# Patient Record
Sex: Female | Born: 1995
Health system: Southern US, Community
[De-identification: ages and names within clinical notes are randomized; demographics above are authoritative.]

## PROBLEM LIST (undated history)

## (undated) DIAGNOSIS — G43909 Migraine, unspecified, not intractable, without status migrainosus: Secondary | ICD-10-CM

## (undated) DIAGNOSIS — R51 Headache: Secondary | ICD-10-CM

## (undated) DIAGNOSIS — B019 Varicella without complication: Secondary | ICD-10-CM

## (undated) DIAGNOSIS — D649 Anemia, unspecified: Secondary | ICD-10-CM

## (undated) DIAGNOSIS — R519 Headache, unspecified: Secondary | ICD-10-CM

## (undated) HISTORY — DX: Varicella without complication: B01.9

## (undated) HISTORY — DX: Headache: R51

## (undated) HISTORY — DX: Migraine, unspecified, not intractable, without status migrainosus: G43.909

## (undated) HISTORY — DX: Headache, unspecified: R51.9

## (undated) HISTORY — DX: Anemia, unspecified: D64.9

---
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
## 2016-03-12 LAB — HM PAP SMEAR

## 2016-11-19 DIAGNOSIS — J029 Acute pharyngitis, unspecified: Secondary | ICD-10-CM | POA: Diagnosis not present

## 2016-11-19 DIAGNOSIS — J0300 Acute streptococcal tonsillitis, unspecified: Secondary | ICD-10-CM | POA: Diagnosis not present

## 2017-03-05 ENCOUNTER — Encounter: Payer: Self-pay | Admitting: Family Medicine

## 2017-03-05 ENCOUNTER — Ambulatory Visit (INDEPENDENT_AMBULATORY_CARE_PROVIDER_SITE_OTHER): Payer: 59 | Admitting: Family Medicine

## 2017-03-05 ENCOUNTER — Ambulatory Visit: Payer: Self-pay | Admitting: Family Medicine

## 2017-03-05 VITALS — BP 80/60 | HR 80 | Temp 98.3°F | Ht 67.0 in | Wt 161.9 lb

## 2017-03-05 DIAGNOSIS — Z7689 Persons encountering health services in other specified circumstances: Secondary | ICD-10-CM | POA: Diagnosis not present

## 2017-03-05 DIAGNOSIS — K30 Functional dyspepsia: Secondary | ICD-10-CM | POA: Diagnosis not present

## 2017-03-05 DIAGNOSIS — R51 Headache: Secondary | ICD-10-CM | POA: Diagnosis not present

## 2017-03-05 DIAGNOSIS — R110 Nausea: Secondary | ICD-10-CM

## 2017-03-05 DIAGNOSIS — R519 Headache, unspecified: Secondary | ICD-10-CM

## 2017-03-05 LAB — COMPREHENSIVE METABOLIC PANEL
ALBUMIN: 4.1 g/dL (ref 3.5–5.2)
ALK PHOS: 60 U/L (ref 39–117)
ALT: 10 U/L (ref 0–35)
AST: 17 U/L (ref 0–37)
BILIRUBIN TOTAL: 0.2 mg/dL (ref 0.2–1.2)
BUN: 8 mg/dL (ref 6–23)
CO2: 28 mEq/L (ref 19–32)
Calcium: 8.3 mg/dL — ABNORMAL LOW (ref 8.4–10.5)
Chloride: 103 mEq/L (ref 96–112)
Creatinine, Ser: 0.61 mg/dL (ref 0.40–1.20)
GFR: 128.43 mL/min (ref >60.00)
Glucose, Bld: 84 mg/dL (ref 70–99)
POTASSIUM: 4 meq/L (ref 3.5–5.1)
SODIUM: 138 meq/L (ref 135–145)
TOTAL PROTEIN: 7.1 g/dL (ref 6.0–8.3)

## 2017-03-05 LAB — CBC
HEMATOCRIT: 35.6 % — AB (ref 36.0–46.0)
HEMOGLOBIN: 10.8 g/dL — AB (ref 12.0–15.0)
MCHC: 30.3 g/dL (ref 30.0–36.0)
MCV: 70.2 fl — ABNORMAL LOW (ref 78.0–100.0)
Platelets: 319 10*3/uL (ref 150.0–400.0)
RBC: 5.07 Mil/uL (ref 3.87–5.11)
RDW: 16.1 % — ABNORMAL HIGH (ref 11.5–15.5)
WBC: 5.7 10*3/uL (ref 4.0–10.5)

## 2017-03-05 NOTE — Progress Notes (Signed)
HPI:  April Gibbs is here to establish care. She sees a gynecologist, Dr. Alesia Richards, for her birth control in GYN physicals.   Has the following chronic problems that require follow up and concerns today:  History of migraines and frequent headaches: -She had this all through high school and college, headaches are actually improved now -Rarely has migraines anymore -She does have several headaches per week that are minor, she uses essential oils for this -She thinks it may be related to looking at her computer screen all day -These are unchanged and have no alarm features -does not use over-the-counter analgesics more than twice weekly  Indigestion/chronic constipation: -For many years -Takes a fiber supplement every morning in her coffee -Gets bloating, epigastric discomfort and nausea with food - this has been worse for about the last year -Denies diarrhea, vomiting, melena, hematochezia, mucus in the stools, unexplained weight loss, fever or malaise -Periods are monthly and regular, reports her first day of her last menstrual period was now  ROS negative for unless reported above: fevers, unintentional weight loss, hearing or vision loss, chest pain, palpitations, struggling to breath, hemoptysis, melena, hematochezia, hematuria, falls, loc, si, thoughts of self harm  Past Medical History:  Diagnosis Date  . Chicken pox   . Frequent headaches   . Migraines     Past Surgical History:  Procedure Laterality Date  . WISDOM TOOTH EXTRACTION  2013    Family History  Problem Relation Age of Onset  . Miscarriages / [place] Mother   . Alcohol abuse Father   . Depression Father   . Diabetes Father   . Heart disease Father   . Hypertension Father   . Learning disabilities Brother   . Birth defects Maternal Grandmother   . Asthma Maternal Grandmother   . Diabetes Maternal Grandfather   . Hyperlipidemia Maternal Grandfather   . Hypertension Maternal Grandfather   .  Birth defects Maternal Grandfather   . Arthritis Paternal Grandmother   . Asthma Paternal Grandmother   . Diabetes Paternal Grandmother   . Hyperlipidemia Paternal Grandmother   . Hypertension Paternal Grandmother   . Miscarriages / Stillbirths Paternal Grandmother   . Cancer Paternal Grandfather   . Early death Paternal Grandfather   . Hypertension Paternal Grandfather     Social History   Social History  . Marital status: Single    Spouse name: N/A  . Number of children: N/A  . Years of education: N/A   Social History Main Topics  . Smoking status: Never Smoker  . Smokeless tobacco: Never Used  . Alcohol use None  . Drug use: Unknown  . Sexual activity: Not Asked   Other Topics Concern  . None   Social History Narrative   Work or Water engineer, [employer]      Home Situation:this with her fianc      Spiritual Beliefs:      Lifestyle:        Current Outpatient Prescriptions:  .  Norgestimate-Ethinyl Estradiol Triphasic (TRI-PREVIFEM) 0.18/0.215/0.25 MG-35 MCG tablet, Take by mouth., Disp: , Rfl:   EXAM:  Vitals:   03/05/17 1055  BP: (!) 80/60  Pulse: 80  Temp: 98.3 F (36.8 C)    Body mass index is 25.36 kg/m.  GENERAL: vitals reviewed and listed above, alert, oriented, appears well hydrated and in no acute distress  HEENT: atraumatic, conjunttiva clear, no obvious abnormalities on inspection of external nose and ears  NECK: no obvious masses on inspection  LUNGS: clear  to auscultation bilaterally, no wheezes, rales or rhonchi, good air movement  CV: HRRR, no peripheral edema  MS: moves all extremities without noticeable abnormality  PSYCH: pleasant and cooperative, no obvious depression or anxiety  ASSESSMENT AND PLAN:  Discussed the following assessment and plan:  Encounter to establish care  Frequent headaches  Indigestion - Plan: CBC, Celiac Disease Comprehensive Panel with Reflexes, Comprehensive metabolic panel,  Helicobacter pylori special antigen  Nausea -We reviewed the PMH, PSH, FH, SH, Meds and Allergies. -see patient instructions and orders for plan  -Patient advised to return or notify a doctor immediately if symptoms worsen or persist or new concerns arise.  Patient Instructions  BEFORE YOU LEAVE: -follow up: 1 month -labs  We have ordered labs or studies at this visit. It can take up to 1-2 weeks for results and processing. IF results require follow up or explanation, we will call you with instructions. Clinically stable results will be released to your [facility]. If you have not heard from [place] or cannot find your results in [facility] in 2 weeks please contact our office at [phone].  If you are not yet signed up for [facility], please consider signing up.  Nexium over the counter once daily for 2 weeks  Tiger balm (menthol) can help for the mild headaches  Ensure good posture at computer  Try a trial off dairy if the above measures do not help your stomach issues   Food Choices for Gastroesophageal Reflux Disease, Adult When you have gastroesophageal reflux disease (GERD), the foods you eat and your eating habits are very important. Choosing the right foods can help ease your discomfort. What guidelines do I need to follow?  Choose fruits, vegetables, whole grains, and low-fat dairy products.  Choose low-fat meat, fish, and poultry.  Limit fats such as oils, salad dressings, butter, nuts, and avocado.  Keep a food diary. This helps you identify foods that cause symptoms.  Avoid foods that cause symptoms. These may be different for everyone.  Eat small meals often instead of 3 large meals a day.  Eat your meals slowly, in a place where you are relaxed.  Limit fried foods.  Cook foods using methods other than frying.  Avoid drinking alcohol.  Avoid drinking large amounts of liquids with your meals.  Avoid bending over or lying down until 2-3 hours after eating. What  foods are not recommended? These are some foods and drinks that may make your symptoms worse: Vegetables Tomatoes. Tomato juice. Tomato and spaghetti sauce. Chili peppers. Onion and garlic. Horseradish. Fruits Oranges, grapefruit, and lemon (fruit and juice). Meats High-fat meats, fish, and poultry. This includes hot dogs, ribs, ham, sausage, salami, and bacon. Dairy Whole milk and chocolate milk. Sour cream. Cream. Butter. Ice cream. Cream cheese. Drinks Coffee and tea. Bubbly (carbonated) drinks or energy drinks. Condiments Hot sauce. Barbecue sauce. Sweets/Desserts Chocolate and cocoa. Donuts. Peppermint and spearmint. Fats and Oils High-fat foods. This includes [place] fries and potato chips. Other Vinegar. Strong spices. This includes black pepper, white pepper, red pepper, cayenne, curry powder, cloves, ginger, and chili powder. The items listed above may not be a complete list of foods and drinks to avoid. Contact your dietitian for more information. This information is not intended to replace advice given to you by your health care provider. Make sure you discuss any questions you have with your health care provider. Document Released: 11/10/2011 Document Revised: 10/17/2015 Document Reviewed: 03/15/2013 Elsevier Interactive Patient Education  2017 [employer].  Colin Benton R.

## 2017-03-05 NOTE — Patient Instructions (Signed)
BEFORE YOU LEAVE: -follow up: 1 month -labs  We have ordered labs or studies at this visit. It can take up to 1-2 weeks for results and processing. IF results require follow up or explanation, we will call you with instructions. Clinically stable results will be released to your [facility]. If you have not heard from [place] or cannot find your results in [facility] in 2 weeks please contact our office at [phone].  If you are not yet signed up for [facility], please consider signing up.  Nexium over the counter once daily for 2 weeks  Tiger balm (menthol) can help for the mild headaches  Ensure good posture at computer  Try a trial off dairy if the above measures do not help your stomach issues   Food Choices for Gastroesophageal Reflux Disease, Adult When you have gastroesophageal reflux disease (GERD), the foods you eat and your eating habits are very important. Choosing the right foods can help ease your discomfort. What guidelines do I need to follow?  Choose fruits, vegetables, whole grains, and low-fat dairy products.  Choose low-fat meat, fish, and poultry.  Limit fats such as oils, salad dressings, butter, nuts, and avocado.  Keep a food diary. This helps you identify foods that cause symptoms.  Avoid foods that cause symptoms. These may be different for everyone.  Eat small meals often instead of 3 large meals a day.  Eat your meals slowly, in a place where you are relaxed.  Limit fried foods.  Cook foods using methods other than frying.  Avoid drinking alcohol.  Avoid drinking large amounts of liquids with your meals.  Avoid bending over or lying down until 2-3 hours after eating. What foods are not recommended? These are some foods and drinks that may make your symptoms worse: Vegetables Tomatoes. Tomato juice. Tomato and spaghetti sauce. Chili peppers. Onion and garlic. Horseradish. Fruits Oranges, grapefruit, and lemon (fruit and juice). Meats High-fat meats,  fish, and poultry. This includes hot dogs, ribs, ham, sausage, salami, and bacon. Dairy Whole milk and chocolate milk. Sour cream. Cream. Butter. Ice cream. Cream cheese. Drinks Coffee and tea. Bubbly (carbonated) drinks or energy drinks. Condiments Hot sauce. Barbecue sauce. Sweets/Desserts Chocolate and cocoa. Donuts. Peppermint and spearmint. Fats and Oils High-fat foods. This includes [place] fries and potato chips. Other Vinegar. Strong spices. This includes black pepper, white pepper, red pepper, cayenne, curry powder, cloves, ginger, and chili powder. The items listed above may not be a complete list of foods and drinks to avoid. Contact your dietitian for more information. This information is not intended to replace advice given to you by your health care provider. Make sure you discuss any questions you have with your health care provider. Document Released: 11/10/2011 Document Revised: 10/17/2015 Document Reviewed: 03/15/2013 Elsevier Interactive Patient Education  2017 [employer].

## 2017-03-08 ENCOUNTER — Encounter: Payer: Self-pay | Admitting: Internal Medicine

## 2017-03-08 LAB — CELIAC DISEASE COMPREHENSIVE PANEL WITH REFLEXES
(tTG) Ab, IgA: 1 U/mL
Immunoglobulin A: 172 mg/dL (ref 81–463)

## 2017-03-08 NOTE — Addendum Note (Signed)
Addended by: Agnes Lawrence on: 03/08/2017 12:03 PM   Modules accepted: Orders

## 2017-03-11 ENCOUNTER — Telehealth: Payer: Self-pay | Admitting: *Deleted

## 2017-03-11 LAB — HELICOBACTER PYLORI  SPECIAL ANTIGEN
MICRO NUMBER:: 81159456
SPECIMEN QUALITY: ADEQUATE

## 2017-03-11 NOTE — Telephone Encounter (Signed)
Copied from [place] #[id number]. Topic: Inquiry >> Mar 10, 2017  4:28 PM Suggs, Tammy E wrote: Reason for CRM: Patient is requesting a call back.  She states she received a call about lab results but it wasn't the best time so she can't remember what all was said. She wants to discuss these again.    03/11/2017-5:39pm I left a detailed message with the results at the pts cell number and asked that she call back if she has any questions.

## 2017-03-16 ENCOUNTER — Ambulatory Visit (INDEPENDENT_AMBULATORY_CARE_PROVIDER_SITE_OTHER): Payer: 59 | Admitting: Internal Medicine

## 2017-03-16 ENCOUNTER — Encounter: Payer: Self-pay | Admitting: Internal Medicine

## 2017-03-16 VITALS — BP 88/60 | HR 76 | Ht 66.5 in | Wt 162.4 lb

## 2017-03-16 DIAGNOSIS — K219 Gastro-esophageal reflux disease without esophagitis: Secondary | ICD-10-CM

## 2017-03-16 DIAGNOSIS — R1013 Epigastric pain: Secondary | ICD-10-CM

## 2017-03-16 DIAGNOSIS — D509 Iron deficiency anemia, unspecified: Secondary | ICD-10-CM

## 2017-03-16 DIAGNOSIS — R110 Nausea: Secondary | ICD-10-CM | POA: Diagnosis not present

## 2017-03-16 MED ORDER — FERROUS SULFATE 325 (65 FE) MG PO TABS: 325.0000 mg | ORAL_TABLET | Freq: Two times a day (BID) | ORAL | 6 refills | Status: DC

## 2017-03-16 NOTE — Progress Notes (Signed)
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 Maudie Mercury with chief complaints of abdominal pain, nausea, and irregular bowel habits. Patient denies prior history of GI evaluations. She reports that her symptoms began approximately 1 year ago. She describes epigastric discomfort which is often occurring after eating. This typically last 3-4 hours. Often helped by belching when she consumes carbonated products. Also intermittent problems with nausea but no vomiting. Recently placed on over-the-counter PPI approximately 1 week ago. This did help classic reflux symptoms but not other symptoms. Her weight is stable. She has noticed dark stools but has been consuming Pepto-Bismol. She tells me that her bowel habits are unchanged over the years. Typically has a bowel movement every 3-5 days. No improvement in any abdominal complaints post defecation. Some bloating. Recent blood work revealed negative Helicobacter pylori stool antigen. Normal comprehensive metabolic panel including liver tests. However she was anemic with hemoglobin 10.8 and low MCV at 70.2. This is new. Patient has regular menstrual periods. She also is a regular blood donor multiple times per year over the years. She does not take an iron supplement. She rarely uses NSAIDs.  REVIEW OF SYSTEMS:  All non-GI ROS negative unless otherwise stated in the history of present illness except for back pain, headaches, fatigue, menstrual cramps  Past Medical History:  Diagnosis Date  . Anemia   . Chicken pox   . Frequent headaches   . Migraines     Past Surgical History:  Procedure Laterality Date  . WISDOM TOOTH EXTRACTION  2013    Social History Alis Sawchuk  reports that she has never smoked. She has never used smokeless tobacco. She reports that she drinks alcohol. She reports that she does not use  drugs.  family history includes Alcohol abuse in her father; Arthritis in her paternal grandmother; Asthma in her maternal grandmother and paternal grandmother; Birth defects in her maternal grandfather and maternal grandmother; Colon cancer in her paternal grandfather; Depression in her father; Diabetes in her father, maternal grandfather, and paternal grandmother; Early death in her paternal grandfather; Esophageal cancer in her maternal grandmother; Heart disease in her father; Hyperlipidemia in her maternal grandfather and paternal grandmother; Hypertension in her father, maternal grandfather, paternal grandfather, and paternal grandmother; Learning disabilities in her brother; Liver disease in her father; Miscarriages / Stillbirths in her mother and paternal grandmother; Ovarian cancer in her paternal aunt.  No Known Allergies     PHYSICAL EXAMINATION: Vital signs: BP (!) 88/60 (BP Location: Left Arm, Patient Position: Sitting, Cuff Size: Normal)   Pulse 76   Ht 5' 6.5" (1.689 m) Comment: height measured without shoes  Wt 162 lb 6 oz (73.7 kg)   LMP 03/02/2017 (Exact Date)   BMI 25.82 kg/m   Constitutional: generally well-appearing, no acute distress Psychiatric: alert and oriented x3, cooperative Eyes: extraocular movements intact, anicteric, conjunctiva pink Mouth: oral pharynx moist, no lesions Neck: supple no lymphadenopathy Cardiovascular: heart regular rate and rhythm, no murmur Lungs: clear to auscultation bilaterally Abdomen: soft, nontender, nondistended, no obvious ascites, no peritoneal signs, normal bowel sounds, no organomegaly Rectal: Omitted Extremities: no clubbing, cyanosis, or lower extremity edema bilaterally Skin: no lesions on visible extremities Neuro: No focal deficits. Cranial nerves intact.   ASSESSMENT:  #1. Chronic epigastric discomfort often exacerbated by meals and associated with nausea. Rule out ulcer. Rule out GERD equivalent. Rule  out  pancreaticobiliary. Rule out gastroparesis #2. GERD. Classic symptoms seemingly improved with recent initiation of low-dose PPI #3. Iron deficiency anemia. Most likely resulting from the combination of chronic blood donation and chronic menstruation   PLAN:  #1. Reflux precautions #2. Continue PPI . OTC omeprazole or equivalent 20 mg daily #3. Oral iron supplement (65 mg) daily and monitoring of hemoglobin and ferritin #4. Schedule upper endoscopy to evaluate abdominal pain, GERD, and anemia.The nature of the procedure, as well as the risks, benefits, and alternatives were carefully and thoroughly reviewed with the patient. Ample time for discussion and questions allowed. The patient understood, was satisfied, and agreed to proceed. #5. Schedule abdominal ultrasound to evaluate upper abdominal pain #6. Further recommendations pending the above  A copy of this consultation note has been sent to Dr. Maudie Mercury

## 2017-03-16 NOTE — Patient Instructions (Signed)
We have sent the following medications to your pharmacy for you to pick up at your convenience:  Iron Supplement  Continue over the counter Nexium   You have been scheduled for an abdominal ultrasound at [facility] (1st floor of hospital) on 03/22/2017 at 7:00am. Please arrive 15 minutes prior to your appointment for registration. Make certain not to have anything to eat or drink after midnight prior to your appointment. Should you need to reschedule your appointment, please contact radiology at [phone]. This test typically takes about 30 minutes to perform.  You have been scheduled for an endoscopy. Please follow written instructions given to you at your visit today. If you use inhalers (even only as needed), please bring them with you on the day of your procedure. Your physician has requested that you go to www.startemmi.com and enter the access code given to you at your visit today. This web site gives a general overview about your procedure. However, you should still follow specific instructions given to you by our office regarding your preparation for the procedure.

## 2017-03-22 ENCOUNTER — Ambulatory Visit (HOSPITAL_COMMUNITY): Payer: 59

## 2017-03-23 ENCOUNTER — Encounter: Payer: Self-pay | Admitting: Family Medicine

## 2017-03-23 DIAGNOSIS — Z6824 Body mass index (BMI) 24.0-24.9, adult: Secondary | ICD-10-CM | POA: Diagnosis not present

## 2017-03-23 DIAGNOSIS — Z01419 Encounter for gynecological examination (general) (routine) without abnormal findings: Secondary | ICD-10-CM | POA: Diagnosis not present

## 2017-04-05 ENCOUNTER — Ambulatory Visit: Payer: 59 | Admitting: Family Medicine

## 2017-05-07 ENCOUNTER — Encounter: Payer: 59 | Admitting: Internal Medicine

## 2017-06-14 ENCOUNTER — Telehealth: Payer: Self-pay | Admitting: Family Medicine

## 2017-06-14 ENCOUNTER — Ambulatory Visit: Payer: 59 | Admitting: Family Medicine

## 2017-06-14 ENCOUNTER — Encounter: Payer: Self-pay | Admitting: Family Medicine

## 2017-06-14 VITALS — BP 98/62 | HR 64 | Temp 98.2°F | Ht 66.5 in | Wt 163.7 lb

## 2017-06-14 DIAGNOSIS — R300 Dysuria: Secondary | ICD-10-CM

## 2017-06-14 LAB — POC URINALSYSI DIPSTICK (AUTOMATED)
Glucose, UA: NEGATIVE
KETONES UA: NEGATIVE
Nitrite, UA: POSITIVE
PH UA: 6 (ref 5.0–8.0)
PROTEIN UA: NEGATIVE
RBC UA: NEGATIVE
SPEC GRAV UA: 1.015 (ref 1.010–1.025)
Urobilinogen, UA: 1 E.U./dL

## 2017-06-14 MED ORDER — NITROFURANTOIN MONOHYD MACRO 100 MG PO CAPS: 100.0000 mg | ORAL_CAPSULE | Freq: Two times a day (BID) | ORAL | 0 refills | Status: DC

## 2017-06-14 NOTE — Telephone Encounter (Signed)
Per chart we have never prescribed these medications for pt.  Spoke with pt and advised that we cannot refill medications we have not previously rx'd. Upon questioning she believes she may have a UTI and states that she has been given these medications in the past. She states that she did take an at-home test and it was positive. Advised pt that she needs OV to assess and determine course of treatment. Pt did not want to schedule at this time, states she wcb to make appt. Nothing further needed at this time.

## 2017-06-14 NOTE — Progress Notes (Signed)
HPI:  Acute visit for dysuria: -started yesterday -symptoms include frequency, urgency, burning with urination -no fevers, NVD, flank pain, vaginal symptoms, hematuria -FDLMP 12/24 -reports hx UTI  ROS: See pertinent positives and negatives per HPI.  Past Medical History:  Diagnosis Date  . Anemia   . Chicken pox   . Frequent headaches   . Migraines     Past Surgical History:  Procedure Laterality Date  . WISDOM TOOTH EXTRACTION  2013    Family History  Problem Relation Age of Onset  . Miscarriages / [place] Mother   . Alcohol abuse Father   . Depression Father   . Diabetes Father   . Heart disease Father   . Hypertension Father   . Liver disease Father        fatty liver  . Learning disabilities Brother   . Birth defects Maternal Grandmother   . Asthma Maternal Grandmother   . Esophageal cancer Maternal Grandmother   . Diabetes Maternal Grandfather   . Hyperlipidemia Maternal Grandfather   . Hypertension Maternal Grandfather   . Birth defects Maternal Grandfather   . Arthritis Paternal Grandmother   . Asthma Paternal Grandmother   . Diabetes Paternal Grandmother   . Hyperlipidemia Paternal Grandmother   . Hypertension Paternal Grandmother   . Miscarriages / Stillbirths Paternal Grandmother   . Early death Paternal Grandfather   . Hypertension Paternal Grandfather   . Colon cancer Paternal Grandfather   . Ovarian cancer Paternal Aunt     Social History   Socioeconomic History  . Marital status: Single    Spouse name: None  . Number of children: 0  . Years of education: None  . Highest education level: None  Social Needs  . Financial resource strain: None  . Food insecurity - worry: None  . Food insecurity - inability: None  . Transportation needs - medical: None  . Transportation needs - non-medical: None  Occupational History  . Occupation: adminisration  Tobacco Use  . Smoking status: Never Smoker  . Smokeless tobacco: Never Used   Substance and Sexual Activity  . Alcohol use: Yes    Comment: maybe 2 a month  . Drug use: No  . Sexual activity: None  Other Topics Concern  . None  Social History Narrative   Work or Water engineer, [employer]      Home Situation:this with her fiance      Spiritual Beliefs:Cristian      Lifestyle:regular exercise, feels diet is pretty healthy     Current Outpatient Medications:  .  Esomeprazole Magnesium (NEXIUM 24HR) 20 MG TBEC, Take 1 tablet by mouth daily., Disp: , Rfl:  .  ferrous sulfate (IRON SUPPLEMENT) 325 (65 FE) MG tablet, Take 1 tablet (325 mg total) by mouth 2 (two) times daily., Disp: 60 tablet, Rfl: 6 .  Fiber POWD, Take 1 Dose by mouth daily., Disp: , Rfl:  .  Norgestimate-Ethinyl Estradiol Triphasic (TRI-PREVIFEM) 0.18/0.215/0.25 MG-35 MCG tablet, Take by mouth., Disp: , Rfl:  .  Simethicone (GAS-X PO), Take 1 tablet by mouth as needed., Disp: , Rfl:  .  nitrofurantoin, macrocrystal-monohydrate, (MACROBID) 100 MG capsule, Take 1 capsule (100 mg total) by mouth 2 (two) times daily., Disp: 14 capsule, Rfl: 0  EXAM:  Vitals:   06/14/17 1340  BP: 98/62  Pulse: 64  Temp: 98.2 F (36.8 C)    Body mass index is 26.03 kg/m.  GENERAL: vitals reviewed and listed above, alert, oriented, appears well hydrated and in no acute distress  HEENT: atraumatic, conjunttiva clear, no obvious abnormalities on inspection of external nose and ears  NECK: no obvious masses on inspection  LUNGS: clear to auscultation bilaterally, no wheezes, rales or rhonchi, good air movement  CV: HRRR, no peripheral edema  ABD: soft, nttp, no cva TTP  MS: moves all extremities without noticeable abnormality  PSYCH: pleasant and cooperative, no obvious depression or anxiety  ASSESSMENT AND PLAN:  Discussed the following assessment and plan:  Dysuria - Plan: POCT Urinalysis Dipstick (Automated), Culture, Urine  -udip + smx = likely UTI, opted for empiric tx with  macrobid -culture pending -Patient advised to return or notify a doctor immediately if symptoms worsen or persist or new concerns arise.  Patient Instructions  Please take the antibiotic as instructed.  I hope you are feeling better soon! Seek care promptly if your symptoms worsen, new concerns arise or you are not improving with treatment.     April Kern, DO

## 2017-06-14 NOTE — Patient Instructions (Signed)
Please take the antibiotic as instructed.  I hope you are feeling better soon! Seek care promptly if your symptoms worsen, new concerns arise or you are not improving with treatment.

## 2017-06-14 NOTE — Telephone Encounter (Signed)
Copied from [place]. Topic: Quick Communication - Rx Refill/Question >> Jun 14, 2017  9:09 AM April Gibbs, NT wrote: Medication: amoxicillin and diflucan   Has the patient contacted their pharmacy? no   (Agent: If no, request that the patient contact the pharmacy for the refill.)   Preferred Pharmacy (with phone number or street name):[id number] [address], [place] [address] Phone: [phone] Fax: [phone]     Agent: Please be advised that RX refills may take up to 3 business days. We ask that you follow-up with your pharmacy.

## 2017-06-15 LAB — URINE CULTURE
MICRO NUMBER: 90085586
SPECIMEN QUALITY:: ADEQUATE

## 2017-07-07 DIAGNOSIS — N390 Urinary tract infection, site not specified: Secondary | ICD-10-CM | POA: Diagnosis not present

## 2017-07-07 DIAGNOSIS — J00 Acute nasopharyngitis [common cold]: Secondary | ICD-10-CM | POA: Diagnosis not present

## 2017-08-02 DIAGNOSIS — N3000 Acute cystitis without hematuria: Secondary | ICD-10-CM | POA: Diagnosis not present

## 2017-08-06 DIAGNOSIS — R399 Unspecified symptoms and signs involving the genitourinary system: Secondary | ICD-10-CM | POA: Diagnosis not present

## 2017-09-21 DIAGNOSIS — R300 Dysuria: Secondary | ICD-10-CM | POA: Diagnosis not present

## 2017-09-24 DIAGNOSIS — D225 Melanocytic nevi of trunk: Secondary | ICD-10-CM | POA: Diagnosis not present

## 2017-09-24 DIAGNOSIS — D485 Neoplasm of uncertain behavior of skin: Secondary | ICD-10-CM | POA: Diagnosis not present

## 2017-09-24 DIAGNOSIS — D2272 Melanocytic nevi of left lower limb, including hip: Secondary | ICD-10-CM | POA: Diagnosis not present

## 2017-10-11 ENCOUNTER — Other Ambulatory Visit: Payer: Self-pay | Admitting: Internal Medicine

## 2017-11-04 DIAGNOSIS — Z1283 Encounter for screening for malignant neoplasm of skin: Secondary | ICD-10-CM | POA: Diagnosis not present

## 2017-11-06 NOTE — Progress Notes (Signed)
HPI:  Using dictation device. Unfortunately this device frequently misinterprets words/phrases.  Here for CPE:  -Concerns and/or follow up today:  Had extensive labs for life insurance a few months ago and total cholesterol and triglycerides were elevated. She is fasting and would like to recheck her cholesterol. Sees dermatologist and was seen recently. Sees gyn for pap smears and breast exams.  -Diet: variety of foods, balance and well rounded, does eat eggs daily -Exercise: regular exercise -Taking folic acid, vitamin D or calcium: no - but taking several supplements -Diabetes and Dyslipidemia Screening: done with life insurance and triglycerides and total cholesterol elevated - fasting today for recheck -Vaccines: see vaccine section EPIC -pap history: reports sees gyn for this and utd -FDLMP: see nursing notes -sexual activity: yes, female partner, no new partners -wants STI testing (Hep C if born [birth year]-65): no - declined -FH breast, colon or ovarian ca: see FH Last mammogram: n/a Last colon cancer screening: n/a Breast Ca Risk Assessment: see family history and pt history DEXA (>/= 3): n/a  -Alcohol, Tobacco, drug use: see social history  Review of Systems - no fevers, unintentional weight loss, vision loss, hearing loss, chest pain, sob, hemoptysis, melena, hematochezia, hematuria, genital discharge, changing or concerning skin lesions, bleeding, bruising, loc, thoughts of self harm or SI  Past Medical History:  Diagnosis Date  . Anemia   . Chicken pox   . Frequent headaches   . Migraines     Past Surgical History:  Procedure Laterality Date  . WISDOM TOOTH EXTRACTION  2013    Family History  Problem Relation Age of Onset  . Miscarriages / [place] Mother   . Alcohol abuse Father   . Depression Father   . Diabetes Father   . Heart disease Father   . Hypertension Father   . Liver disease Father        fatty liver  . Learning disabilities Brother   .  Birth defects Maternal Grandmother   . Asthma Maternal Grandmother   . Esophageal cancer Maternal Grandmother   . Diabetes Maternal Grandfather   . Hyperlipidemia Maternal Grandfather   . Hypertension Maternal Grandfather   . Birth defects Maternal Grandfather   . Arthritis Paternal Grandmother   . Asthma Paternal Grandmother   . Diabetes Paternal Grandmother   . Hyperlipidemia Paternal Grandmother   . Hypertension Paternal Grandmother   . Miscarriages / Stillbirths Paternal Grandmother   . Early death Paternal Grandfather   . Hypertension Paternal Grandfather   . Colon cancer Paternal Grandfather   . Ovarian cancer Paternal Aunt     Social History   Socioeconomic History  . Marital status: Single    Spouse name: Not on file  . Number of children: 0  . Years of education: Not on file  . Highest education level: Not on file  Occupational History  . Occupation: adminisration  Social Needs  . Financial resource strain: Not on file  . Food insecurity:    Worry: Not on file    Inability: Not on file  . Transportation needs:    Medical: Not on file    Non-medical: Not on file  Tobacco Use  . Smoking status: Never Smoker  . Smokeless tobacco: Never Used  Substance and Sexual Activity  . Alcohol use: Yes    Comment: maybe 2 a month  . Drug use: No  . Sexual activity: Not on file  Lifestyle  . Physical activity:    Days per week: Not on file  Minutes per session: Not on file  . Stress: Not on file  Relationships  . Social connections:    Talks on phone: Not on file    Gets together: Not on file    Attends religious service: Not on file    Active member of club or organization: Not on file    Attends meetings of clubs or organizations: Not on file    Relationship status: Not on file  Other Topics Concern  . Not on file  Social History Narrative   Work or Water engineer, [employer]      Home Situation:this with her fiance      Spiritual Beliefs:Cristian       Lifestyle:regular exercise, feels diet is pretty healthy     Current Outpatient Medications:  .  Esomeprazole Magnesium (NEXIUM 24HR) 20 MG TBEC, Take 1 tablet by mouth daily., Disp: , Rfl:  .  ferrous sulfate 325 (65 FE) MG tablet, TAKE 1 TABLET BY MOUTH TWICE A DAY, Disp: 60 tablet, Rfl: 3 .  Fiber POWD, Take 1 Dose by mouth daily., Disp: , Rfl:  .  nitrofurantoin, macrocrystal-monohydrate, (MACROBID) 100 MG capsule, Take 1 capsule (100 mg total) by mouth 2 (two) times daily., Disp: 14 capsule, Rfl: 0 .  Norgestimate-Ethinyl Estradiol Triphasic (TRI-PREVIFEM) 0.18/0.215/0.25 MG-35 MCG tablet, Take by mouth., Disp: , Rfl:  .  Simethicone (GAS-X PO), Take 1 tablet by mouth as needed., Disp: , Rfl:   EXAM:  There were no vitals filed for this visit.  GENERAL: vitals reviewed and listed below, alert, oriented, appears well hydrated and in no acute distress  HEENT: head atraumatic, PERRLA, normal appearance of eyes, ears, nose and mouth. moist mucus membranes.  NECK: supple, no masses or lymphadenopathy  LUNGS: clear to auscultation bilaterally, no rales, rhonchi or wheeze  CV: HRRR, no peripheral edema or cyanosis, normal pedal pulses  ABDOMEN: bowel sounds normal, soft, non tender to palpation, no masses, no rebound or guarding  GU/BREAST: sees gyn  SKIN: no rash or abnormal lesions  MS: normal gait, moves all extremities normally  NEURO: normal gait, speech and thought processing grossly intact, muscle tone grossly intact throughout  PSYCH: normal affect, pleasant and cooperative  ASSESSMENT AND PLAN:  Discussed the following assessment and plan:  PREVENTIVE EXAM: -Discussed and advised all [place] preventive services health task force level A and B recommendations for age, sex and risks. -Advised at least 150 minutes of exercise per week and a healthy diet with avoidance of (less then 1 serving per week) processed foods, white starches, red meat, fast foods and sweets  and consisting of: * 5-9 servings of fresh fruits and vegetables (not corn or potatoes) *nuts and seeds, beans *olives and olive oil *lean meats such as fish and white chicken  *whole grains -labs, studies and vaccines per orders this encounter  2. Dyslipidemia -recheck lipids -advised Med diet - see pt instru  Discussed risks/benefit supplements and advised to stop unless truly needed.    Patient advised to return to clinic immediately if symptoms worsen or persist or new concerns.  There are no Patient Instructions on file for this visit.  No follow-ups on file.  Lucretia Kern, DO

## 2017-11-08 ENCOUNTER — Ambulatory Visit (INDEPENDENT_AMBULATORY_CARE_PROVIDER_SITE_OTHER): Payer: 59 | Admitting: Family Medicine

## 2017-11-08 VITALS — BP 118/64 | HR 66 | Temp 97.8°F | Resp 16 | Ht 66.5 in | Wt 156.6 lb

## 2017-11-08 DIAGNOSIS — E785 Hyperlipidemia, unspecified: Secondary | ICD-10-CM | POA: Diagnosis not present

## 2017-11-08 DIAGNOSIS — Z0000 Encounter for general adult medical examination without abnormal findings: Secondary | ICD-10-CM

## 2017-11-08 LAB — LIPID PANEL
CHOL/HDL RATIO: 3
Cholesterol: 218 mg/dL — ABNORMAL HIGH (ref 0–200)
HDL: 71.5 mg/dL (ref >39.00)
LDL CALC: 127 mg/dL — AB (ref 0–99)
NONHDL: 146.19
Triglycerides: 98 mg/dL (ref 0.0–149.0)
VLDL: 19.6 mg/dL (ref 0.0–40.0)

## 2017-11-08 NOTE — Patient Instructions (Addendum)
BEFORE YOU LEAVE: -PHQ9 in Epic -labs -follow up: yearly for CPE or sooner as needed  We have ordered labs or studies at this visit. It can take up to 1-2 weeks for results and processing. IF results require follow up or explanation, we will call you with instructions. Clinically stable results will be released to your [facility]. If you have not heard from [place] or cannot find your results in [facility] in 2 weeks please contact our office at [phone].  If you are not yet signed up for [facility], please consider signing up.   We recommend the following healthy lifestyle for LIFE: 1) Small portions. But, make sure to get regular (at least 3 per day), healthy meals and small healthy snacks if needed.  2) Eat a healthy clean diet.   TRY TO EAT: -at least 5-7 servings of low sugar, colorful, and nutrient rich vegetables per day (not corn, potatoes or bananas.) -berries are the best choice if you wish to eat fruit (only eat small amounts if trying to reduce weight)  -lean meets (fish, white meat of chicken or [place]) -vegan proteins for some meals - beans or tofu, whole grains, nuts and seeds -Replace bad fats with good fats - good fats include: fish, nuts and seeds, canola oil, olive oil -small amounts of low fat or non fat dairy -small amounts of100 % whole grains - check the lables -drink plenty of water  AVOID: -SUGAR, sweets, anything with added sugar, corn syrup or sweeteners - must read labels as even foods advertised as "healthy" often are loaded with sugar -if you must have a sweetener, small amounts of stevia may be best -sweetened beverages and artificially sweetened beverages -simple starches (rice, bread, potatoes, pasta, chips, etc - small amounts of 100% whole grains are ok) -red meat, pork, butter -fried foods, fast food, processed food, excessive dairy, eggs and coconut.  3)Get at least 150 minutes of sweaty aerobic exercise per week.  4)Reduce stress - consider counseling,  meditation and relaxation to balance other aspects of your life.   Preventive Care 18-39 Years, Female Preventive care refers to lifestyle choices and visits with your health care provider that can promote health and wellness. What does preventive care include?  A yearly physical exam. This is also called an annual well check.  Dental exams once or twice a year.  Routine eye exams. Ask your health care provider how often you should have your eyes checked.  Personal lifestyle choices, including: ? Daily care of your teeth and gums. ? Regular physical activity. ? Eating a healthy diet. ? Avoiding tobacco and drug use. ? Limiting alcohol use. ? Practicing safe sex. ? Taking vitamin and mineral supplements as recommended by your health care provider. What happens during an annual well check? The services and screenings done by your health care provider during your annual well check will depend on your age, overall health, lifestyle risk factors, and family history of disease. Counseling Your health care provider may ask you questions about your:  Alcohol use.  Tobacco use.  Drug use.  Emotional well-being.  Home and relationship well-being.  Sexual activity.  Eating habits.  Work and work Statistician.  Method of birth control.  Menstrual cycle.  Pregnancy history.  Screening You may have the following tests or measurements:  Height, weight, and BMI.  Diabetes screening. This is done by checking your blood sugar (glucose) after you have not eaten for a while (fasting).  Blood pressure.  Lipid and cholesterol levels.  These may be checked every 5 years starting at age 59.  Skin check.  Hepatitis C blood test.  Hepatitis B blood test.  Sexually transmitted disease (STD) testing.  BRCA-related cancer screening. This may be done if you have a family history of breast, ovarian, tubal, or peritoneal cancers.  Pelvic exam and Pap test. This may be done every 3  years starting at age 41. Starting at age 37, this may be done every 5 years if you have a Pap test in combination with an HPV test.  Discuss your test results, treatment options, and if necessary, the need for more tests with your health care provider. Vaccines Your health care provider may recommend certain vaccines, such as:  Influenza vaccine. This is recommended every year.  Tetanus, diphtheria, and acellular pertussis (Tdap, Td) vaccine. You may need a Td booster every 10 years.  Varicella vaccine. You may need this if you have not been vaccinated.  HPV vaccine. If you are 85 or younger, you may need three doses over 6 months.  Measles, mumps, and rubella (MMR) vaccine. You may need at least one dose of MMR. You may also need a second dose.  Pneumococcal 13-valent conjugate (PCV13) vaccine. You may need this if you have certain conditions and were not previously vaccinated.  Pneumococcal polysaccharide (PPSV23) vaccine. You may need one or two doses if you smoke cigarettes or if you have certain conditions.  Meningococcal vaccine. One dose is recommended if you are age 43-21 years and a first-year college student living in a residence hall, or if you have one of several medical conditions. You may also need additional booster doses.  Hepatitis A vaccine. You may need this if you have certain conditions or if you travel or work in places where you may be exposed to hepatitis A.  Hepatitis B vaccine. You may need this if you have certain conditions or if you travel or work in places where you may be exposed to hepatitis B.  Haemophilus influenzae type b (Hib) vaccine. You may need this if you have certain risk factors.  Talk to your health care provider about which screenings and vaccines you need and how often you need them. This information is not intended to replace advice given to you by your health care provider. Make sure you discuss any questions you have with your health care  provider. Document Released: 07/07/2001 Document Revised: 01/29/2016 Document Reviewed: 03/12/2015 Elsevier Interactive Patient Education  [employer].

## 2017-11-09 ENCOUNTER — Telehealth: Payer: Self-pay | Admitting: Family Medicine

## 2017-11-09 NOTE — Telephone Encounter (Signed)
Lab result notes entered by Dr Maudie Mercury on 11-08-2017 read to patient  Appointment scheduled for 6 month followup

## 2017-11-18 ENCOUNTER — Encounter: Payer: 59 | Admitting: Family Medicine

## 2017-12-02 ENCOUNTER — Encounter: Payer: 59 | Admitting: Family Medicine

## 2018-02-12 ENCOUNTER — Other Ambulatory Visit: Payer: Self-pay | Admitting: Internal Medicine

## 2018-02-22 DIAGNOSIS — J0140 Acute pansinusitis, unspecified: Secondary | ICD-10-CM | POA: Diagnosis not present

## 2018-03-16 DIAGNOSIS — Z6824 Body mass index (BMI) 24.0-24.9, adult: Secondary | ICD-10-CM | POA: Diagnosis not present

## 2018-03-16 DIAGNOSIS — Z01419 Encounter for gynecological examination (general) (routine) without abnormal findings: Secondary | ICD-10-CM | POA: Diagnosis not present

## 2018-03-16 LAB — HM PAP SMEAR

## 2018-04-05 DIAGNOSIS — J0140 Acute pansinusitis, unspecified: Secondary | ICD-10-CM | POA: Diagnosis not present

## 2018-04-06 DIAGNOSIS — N939 Abnormal uterine and vaginal bleeding, unspecified: Secondary | ICD-10-CM | POA: Diagnosis not present

## 2018-04-06 DIAGNOSIS — Z309 Encounter for contraceptive management, unspecified: Secondary | ICD-10-CM | POA: Diagnosis not present

## 2018-05-11 ENCOUNTER — Encounter: Payer: Self-pay | Admitting: Family Medicine

## 2018-05-12 ENCOUNTER — Ambulatory Visit: Payer: 59 | Admitting: Family Medicine

## 2018-05-13 NOTE — Progress Notes (Signed)
HPI:  Using dictation device. Unfortunately this device frequently misinterprets words/phrases.  Follow up for hypperlipidemia.Emotional roller coaster the last few months. Got married in November. Then father passed suddenly of ? Heart attack a week ago. Diet not great yet, but trying to do better. Exercises 3 days per week. Coping ok, but lots of stress with planning funeral and sudden loss of her father.   ROS: See pertinent positives and negatives per HPI.  Past Medical History:  Diagnosis Date  . Anemia   . Chicken pox   . Frequent headaches   . Migraines     Past Surgical History:  Procedure Laterality Date  . WISDOM TOOTH EXTRACTION  2013    Family History  Problem Relation Age of Onset  . Miscarriages / [place] Mother   . Alcohol abuse Father   . Depression Father   . Diabetes Father   . Heart disease Father   . Hypertension Father   . Liver disease Father        fatty liver  . Learning disabilities Brother   . Birth defects Maternal Grandmother   . Asthma Maternal Grandmother   . Esophageal cancer Maternal Grandmother   . Diabetes Maternal Grandfather   . Hyperlipidemia Maternal Grandfather   . Hypertension Maternal Grandfather   . Birth defects Maternal Grandfather   . Arthritis Paternal Grandmother   . Asthma Paternal Grandmother   . Diabetes Paternal Grandmother   . Hyperlipidemia Paternal Grandmother   . Hypertension Paternal Grandmother   . Miscarriages / Stillbirths Paternal Grandmother   . Early death Paternal Grandfather   . Hypertension Paternal Grandfather   . Colon cancer Paternal Grandfather   . Ovarian cancer Paternal Aunt     SOCIAL HX: see hpi   Current Outpatient Medications:  .  ferrous sulfate 325 (65 FE) MG tablet, TAKE 1 TABLET BY MOUTH TWICE A DAY, Disp: 60 tablet, Rfl: 3 .  loratadine (CLARITIN) 10 MG tablet, Take 10 mg by mouth daily as needed. , Disp: , Rfl:  .  Norgestimate-Ethinyl Estradiol Triphasic (TRI-SPRINTEC)  0.18/0.215/0.25 MG-35 MCG tablet, Take 1 tablet by mouth daily., Disp: , Rfl:   EXAM:  Vitals:   05/16/18 0804  BP: 100/70  Pulse: 68  Temp: 98.1 F (36.7 C)    Body mass index is 25.26 kg/m.  GENERAL: vitals reviewed and listed above, alert, oriented, appears well hydrated and in no acute distress  HEENT: atraumatic, conjunttiva clear, no obvious abnormalities on inspection of external nose and ears  NECK: no obvious masses on inspection  LUNGS: clear to auscultation bilaterally, no wheezes, rales or rhonchi, good air movement  CV: HRRR, no peripheral edema  MS: moves all extremities without noticeable abnormality  PSYCH: pleasant and cooperative, no obvious depression or anxiety  ASSESSMENT AND PLAN:  Discussed the following assessment and plan:  Hyperlipidemia, unspecified hyperlipidemia type  Bereavement  -labs in 3 months after lifestyle changes -recommended mediterreanean diet an regular aerobic exercise -supported on loss and offered counseling - brochure provided  -follow up CPE in June, sooner as needed -Patient advised to return or notify a doctor immediately if symptoms worsen or persist or new concerns arise.  Patient Instructions  BEFORE YOU LEAVE: -schedule lab visit in about 3 months for fasting lab -follow up: CPE in June  Consider counseling per brochure. Hang in there!  Eat a healthy Mediterranean diet and get regular aerobic exercise. Check lipids in 3 months.  We recommend the following healthy lifestyle for LIFE: 1)  Small portions. But, make sure to get regular (at least 3 per day), healthy meals and small healthy snacks if needed.  2) Eat a healthy clean diet.   TRY TO EAT: -at least 5-7 servings of low sugar, colorful, and nutrient rich vegetables per day (not corn, potatoes or bananas.) -berries are the best choice if you wish to eat fruit (only eat small amounts if trying to reduce weight)  -lean meets (fish, white meat of chicken  or [place]) -vegan proteins for some meals - beans or tofu, whole grains, nuts and seeds -Replace bad fats with good fats - good fats include: fish, nuts and seeds, canola oil, olive oil -small amounts of low fat or non fat dairy -small amounts of100 % whole grains - check the lables -drink plenty of water  AVOID: -SUGAR, sweets, anything with added sugar, corn syrup or sweeteners - must read labels as even foods advertised as "healthy" often are loaded with sugar -if you must have a sweetener, small amounts of stevia may be best -sweetened beverages and artificially sweetened beverages -simple starches (rice, bread, potatoes, pasta, chips, etc - small amounts of 100% whole grains are ok) -red meat, pork, butter -fried foods, fast food, processed food, excessive dairy, eggs and coconut.  3)Get at least 150 minutes of sweaty aerobic exercise per week.  4)Reduce stress - consider counseling, meditation and relaxation to balance other aspects of your life.     Lucretia Kern, DO

## 2018-05-16 ENCOUNTER — Encounter: Payer: Self-pay | Admitting: Family Medicine

## 2018-05-16 ENCOUNTER — Ambulatory Visit: Payer: 59 | Admitting: Family Medicine

## 2018-05-16 VITALS — BP 100/70 | HR 68 | Temp 98.1°F | Ht 66.5 in | Wt 158.9 lb

## 2018-05-16 DIAGNOSIS — Z1283 Encounter for screening for malignant neoplasm of skin: Secondary | ICD-10-CM | POA: Diagnosis not present

## 2018-05-16 DIAGNOSIS — Z634 Disappearance and death of family member: Secondary | ICD-10-CM

## 2018-05-16 DIAGNOSIS — D225 Melanocytic nevi of trunk: Secondary | ICD-10-CM | POA: Diagnosis not present

## 2018-05-16 DIAGNOSIS — E785 Hyperlipidemia, unspecified: Secondary | ICD-10-CM

## 2018-05-16 NOTE — Patient Instructions (Signed)
BEFORE YOU LEAVE: -schedule lab visit in about 3 months for fasting lab -follow up: CPE in June  Consider counseling per brochure. Hang in there!  Eat a healthy Mediterranean diet and get regular aerobic exercise. Check lipids in 3 months.  We recommend the following healthy lifestyle for LIFE: 1) Small portions. But, make sure to get regular (at least 3 per day), healthy meals and small healthy snacks if needed.  2) Eat a healthy clean diet.   TRY TO EAT: -at least 5-7 servings of low sugar, colorful, and nutrient rich vegetables per day (not corn, potatoes or bananas.) -berries are the best choice if you wish to eat fruit (only eat small amounts if trying to reduce weight)  -lean meets (fish, white meat of chicken or [place]) -vegan proteins for some meals - beans or tofu, whole grains, nuts and seeds -Replace bad fats with good fats - good fats include: fish, nuts and seeds, canola oil, olive oil -small amounts of low fat or non fat dairy -small amounts of100 % whole grains - check the lables -drink plenty of water  AVOID: -SUGAR, sweets, anything with added sugar, corn syrup or sweeteners - must read labels as even foods advertised as "healthy" often are loaded with sugar -if you must have a sweetener, small amounts of stevia may be best -sweetened beverages and artificially sweetened beverages -simple starches (rice, bread, potatoes, pasta, chips, etc - small amounts of 100% whole grains are ok) -red meat, pork, butter -fried foods, fast food, processed food, excessive dairy, eggs and coconut.  3)Get at least 150 minutes of sweaty aerobic exercise per week.  4)Reduce stress - consider counseling, meditation and relaxation to balance other aspects of your life.

## 2018-07-25 DIAGNOSIS — J029 Acute pharyngitis, unspecified: Secondary | ICD-10-CM | POA: Diagnosis not present

## 2018-07-28 DIAGNOSIS — M9903 Segmental and somatic dysfunction of lumbar region: Secondary | ICD-10-CM | POA: Diagnosis not present

## 2018-08-07 DIAGNOSIS — T3695XA Adverse effect of unspecified systemic antibiotic, initial encounter: Secondary | ICD-10-CM | POA: Diagnosis not present

## 2018-08-07 DIAGNOSIS — B379 Candidiasis, unspecified: Secondary | ICD-10-CM | POA: Diagnosis not present

## 2018-08-07 DIAGNOSIS — R300 Dysuria: Secondary | ICD-10-CM | POA: Diagnosis not present

## 2018-08-18 DIAGNOSIS — M9903 Segmental and somatic dysfunction of lumbar region: Secondary | ICD-10-CM | POA: Diagnosis not present

## 2018-08-30 DIAGNOSIS — M9903 Segmental and somatic dysfunction of lumbar region: Secondary | ICD-10-CM | POA: Diagnosis not present

## 2018-09-27 DIAGNOSIS — M9903 Segmental and somatic dysfunction of lumbar region: Secondary | ICD-10-CM | POA: Diagnosis not present

## 2018-10-19 DIAGNOSIS — G514 Facial myokymia: Secondary | ICD-10-CM | POA: Diagnosis not present

## 2018-11-01 ENCOUNTER — Telehealth: Payer: Self-pay | Admitting: *Deleted

## 2018-11-01 ENCOUNTER — Telehealth: Payer: Self-pay | Admitting: Physical Therapy

## 2018-11-01 NOTE — Telephone Encounter (Signed)
Please contact patient and ask if patient would like to transfer to another provider due to Dr. Rogers Blocker being out on maternity leave until August. Also, Dr. Rogers Blocker like to approve her [facility] appointments. No approval needed due to Dr. Maudie Mercury retiring from PCP panel

## 2018-11-01 NOTE — Telephone Encounter (Signed)
Copied from [place] [id number]. Topic: Appointment Scheduling - Transfer of Care >> Nov 01, 2018 12:04 PM Rainey Pines A wrote: Pt is requesting to transfer FROM: Dr.Kim Pt is requesting to transfer TO: Dr. Rogers Blocker Reason for requested transfer: [facility] location is closer to home >> Nov 01, 2018  1:19 PM Cox, DeWitt H, CMA wrote: [facility] protocol is not required for pt's of Dr. Maudie Mercury.

## 2018-11-01 NOTE — Telephone Encounter (Signed)
Copied from [place] [id number]. Topic: Quick Communication - Appointment Cancellation >> Nov 01, 2018 12:02 PM Rainey Pines A wrote: Patient called to cancel appointment scheduled for 11/10/2018. Patient is going to establish care at [facility] instead.

## 2018-11-10 ENCOUNTER — Encounter: Payer: 59 | Admitting: Family Medicine

## 2018-11-10 ENCOUNTER — Encounter: Payer: 59 | Admitting: Internal Medicine

## 2018-11-11 ENCOUNTER — Ambulatory Visit (INDEPENDENT_AMBULATORY_CARE_PROVIDER_SITE_OTHER): Payer: 59 | Admitting: Psychology

## 2018-11-11 DIAGNOSIS — F4323 Adjustment disorder with mixed anxiety and depressed mood: Secondary | ICD-10-CM | POA: Diagnosis not present

## 2018-11-11 DIAGNOSIS — Z634 Disappearance and death of family member: Secondary | ICD-10-CM | POA: Diagnosis not present

## 2018-12-07 ENCOUNTER — Ambulatory Visit (INDEPENDENT_AMBULATORY_CARE_PROVIDER_SITE_OTHER): Payer: 59 | Admitting: Psychology

## 2018-12-07 DIAGNOSIS — Z634 Disappearance and death of family member: Secondary | ICD-10-CM

## 2018-12-07 DIAGNOSIS — F4323 Adjustment disorder with mixed anxiety and depressed mood: Secondary | ICD-10-CM

## 2018-12-22 ENCOUNTER — Ambulatory Visit: Payer: 59 | Admitting: Psychology

## 2018-12-30 ENCOUNTER — Ambulatory Visit (INDEPENDENT_AMBULATORY_CARE_PROVIDER_SITE_OTHER): Payer: 59 | Admitting: Psychology

## 2018-12-30 DIAGNOSIS — Z634 Disappearance and death of family member: Secondary | ICD-10-CM

## 2018-12-30 DIAGNOSIS — F4323 Adjustment disorder with mixed anxiety and depressed mood: Secondary | ICD-10-CM

## 2019-01-04 ENCOUNTER — Ambulatory Visit (INDEPENDENT_AMBULATORY_CARE_PROVIDER_SITE_OTHER): Payer: 59 | Admitting: Psychology

## 2019-01-04 DIAGNOSIS — Z634 Disappearance and death of family member: Secondary | ICD-10-CM

## 2019-01-04 DIAGNOSIS — F4323 Adjustment disorder with mixed anxiety and depressed mood: Secondary | ICD-10-CM | POA: Diagnosis not present

## 2019-01-06 ENCOUNTER — Other Ambulatory Visit: Payer: Self-pay

## 2019-01-06 ENCOUNTER — Ambulatory Visit (INDEPENDENT_AMBULATORY_CARE_PROVIDER_SITE_OTHER): Payer: 59 | Admitting: Family Medicine

## 2019-01-06 ENCOUNTER — Encounter: Payer: Self-pay | Admitting: Family Medicine

## 2019-01-06 VITALS — BP 112/78 | HR 79 | Temp 98.1°F | Ht 68.0 in | Wt 158.4 lb

## 2019-01-06 DIAGNOSIS — Z0000 Encounter for general adult medical examination without abnormal findings: Secondary | ICD-10-CM

## 2019-01-06 LAB — COMPREHENSIVE METABOLIC PANEL
ALT: 11 U/L (ref 0–35)
AST: 16 U/L (ref 0–37)
Albumin: 4.5 g/dL (ref 3.5–5.2)
Alkaline Phosphatase: 77 U/L (ref 39–117)
BUN: 10 mg/dL (ref 6–23)
CO2: 27 mEq/L (ref 19–32)
Calcium: 9.3 mg/dL (ref 8.4–10.5)
Chloride: 100 mEq/L (ref 96–112)
Creatinine, Ser: 0.71 mg/dL (ref 0.40–1.20)
GFR: 99.89 mL/min (ref >60.00)
Glucose, Bld: 81 mg/dL (ref 70–99)
Potassium: 3.9 mEq/L (ref 3.5–5.1)
Sodium: 137 mEq/L (ref 135–145)
Total Bilirubin: 0.5 mg/dL (ref 0.2–1.2)
Total Protein: 7 g/dL (ref 6.0–8.3)

## 2019-01-06 LAB — CBC WITH DIFFERENTIAL/PLATELET
Basophils Absolute: 0 10*3/uL (ref 0.0–0.1)
Basophils Relative: 0.3 % (ref 0.0–3.0)
Eosinophils Absolute: 0 10*3/uL (ref 0.0–0.7)
Eosinophils Relative: 0.2 % (ref 0.0–5.0)
HCT: 42.8 % (ref 36.0–46.0)
Hemoglobin: 14.3 g/dL (ref 12.0–15.0)
Lymphocytes Relative: 29.5 % (ref 12.0–46.0)
Lymphs Abs: 1.9 10*3/uL (ref 0.7–4.0)
MCHC: 33.4 g/dL (ref 30.0–36.0)
MCV: 85.1 fl (ref 78.0–100.0)
Monocytes Absolute: 0.5 10*3/uL (ref 0.1–1.0)
Monocytes Relative: 7.4 % (ref 3.0–12.0)
Neutro Abs: 4.1 10*3/uL (ref 1.4–7.7)
Neutrophils Relative %: 62.6 % (ref 43.0–77.0)
Platelets: 190 10*3/uL (ref 150.0–400.0)
RBC: 5.03 Mil/uL (ref 3.87–5.11)
RDW: 12.3 % (ref 11.5–15.5)
WBC: 6.5 10*3/uL (ref 4.0–10.5)

## 2019-01-06 LAB — LIPID PANEL
Cholesterol: 234 mg/dL — ABNORMAL HIGH (ref 0–200)
HDL: 67.3 mg/dL (ref >39.00)
LDL Cholesterol: 142 mg/dL — ABNORMAL HIGH (ref 0–99)
NonHDL: 167.05
Total CHOL/HDL Ratio: 3
Triglycerides: 127 mg/dL (ref 0.0–149.0)
VLDL: 25.4 mg/dL (ref 0.0–40.0)

## 2019-01-06 LAB — TSH: TSH: 0.93 u[IU]/mL (ref 0.35–4.50)

## 2019-01-06 NOTE — Progress Notes (Signed)
Patient: April Gibbs MRN: [id number] DOB: [birth year] PCP: Orma Flaming, MD     Subjective:  Chief Complaint  Patient presents with  . Transitions Of Care    requesting CPE    HPI: The patient is a 23 y.o. female who presents today for annual exam. She denies any changes to past medical history. There have been no recent hospitalizations. They are following a well balanced diet and exercise plan. Weight has been stable. No complaints today. Transferring from Dr. Maudie Mercury. Has paperwork for insurance to fill out as well.   No family hx of breast or colon cancer.   Followed by derm yearly.   Immunization History  Administered Date(s) Administered  . Influenza-Unspecified 02/26/2017, 02/22/2018  . Tdap 05/25/2016    Pap smear: 02/2018. Followed by OB/GYN. [facility])   Review of Systems  Constitutional: Negative for chills, fatigue and fever.  HENT: Negative for congestion, dental problem, ear pain, hearing loss, rhinorrhea, sore throat and trouble swallowing.   Eyes: Negative for visual disturbance.  Respiratory: Negative for cough, chest tightness and shortness of breath.   Cardiovascular: Negative.  Negative for chest pain, palpitations and leg swelling.  Gastrointestinal: Negative.  Negative for abdominal pain, blood in stool, diarrhea and nausea.  Endocrine: Negative for cold intolerance, polydipsia, polyphagia and polyuria.  Genitourinary: Negative.  Negative for dysuria and hematuria.  Musculoskeletal: Negative.  Negative for arthralgias.  Skin: Negative.  Negative for rash.  Neurological: Negative for dizziness and headaches.  Psychiatric/Behavioral: Negative for dysphoric mood and sleep disturbance. The patient is not nervous/anxious.     Allergies Patient has No Known Allergies.  Past Medical History Patient  has a past medical history of Anemia, Chicken pox, Frequent headaches, and Migraines.  Surgical History Patient  has a past surgical  history that includes Wisdom tooth extraction (2013).  Family History Pateint's family history includes Alcohol abuse in her father; Arthritis in her paternal grandmother; Asthma in her maternal grandmother and paternal grandmother; Birth defects in her maternal grandfather and maternal grandmother; Colon cancer in her paternal grandfather; Depression in her father; Diabetes in her father, maternal grandfather, and paternal grandmother; Early death in her paternal grandfather; Esophageal cancer in her maternal grandmother; Heart disease in her father; Hyperlipidemia in her maternal grandfather and paternal grandmother; Hypertension in her father, maternal grandfather, paternal grandfather, and paternal grandmother; Learning disabilities in her brother; Liver disease in her father; Miscarriages / Stillbirths in her mother and paternal grandmother; Ovarian cancer in her paternal aunt.  Social History Patient  reports that she has never smoked. She has never used smokeless tobacco. She reports current alcohol use. She reports that she does not use drugs.    Objective: Vitals:   01/06/19 0823  BP: 112/78  Pulse: 79  Temp: 98.1 F (36.7 C)  TempSrc: Temporal  SpO2: 100%  Weight: 158 lb 6.4 oz (71.8 kg)  Height: 5\' 8"  (1.727 m)    Body mass index is 24.08 kg/m.  Physical Exam Vitals signs reviewed.  Constitutional:      Appearance: Normal appearance. She is well-developed and normal weight.  HENT:     Head: Normocephalic and atraumatic.     Right Ear: Tympanic membrane, ear canal and external ear normal.     Left Ear: Tympanic membrane, ear canal and external ear normal.     Nose: Nose normal.     Mouth/Throat:     Mouth: Mucous membranes are moist.  Eyes:     Extraocular Movements: Extraocular movements  intact.     Conjunctiva/sclera: Conjunctivae normal.     Pupils: Pupils are equal, round, and reactive to light.  Neck:     Musculoskeletal: Normal range of motion and neck supple.      Thyroid: No thyromegaly.  Cardiovascular:     Rate and Rhythm: Normal rate and regular rhythm.     Pulses: Normal pulses.     Heart sounds: Normal heart sounds. No murmur.  Pulmonary:     Effort: Pulmonary effort is normal.     Breath sounds: Normal breath sounds.  Abdominal:     General: Abdomen is flat. Bowel sounds are normal. There is no distension.     Palpations: Abdomen is soft.     Tenderness: There is no abdominal tenderness.  Lymphadenopathy:     Cervical: No cervical adenopathy.  Skin:    General: Skin is warm and dry.     Capillary Refill: Capillary refill takes less than 2 seconds.     Findings: No rash.  Neurological:     General: No focal deficit present.     Mental Status: She is alert and oriented to person, place, and time.     Cranial Nerves: No cranial nerve deficit.     Coordination: Coordination normal.     Deep Tendon Reflexes: Reflexes normal.  Psychiatric:        Mood and Affect: Mood normal.        Behavior: Behavior normal.        Depression screen [facility] 2/9 01/06/2019  Decreased Interest 0  Down, Depressed, Hopeless 0  PHQ - 2 Score 0    Assessment/plan: 1. Annual physical exam Routine lab work today and will fill out her paperwork. Doing great. Continue healthy lifestyle. utd on HM. F/u in one year or as needed.  Patient counseling [x]    Nutrition: Stressed importance of moderation in sodium/caffeine intake, saturated fat and cholesterol, caloric balance, sufficient intake of fresh fruits, vegetables, fiber, calcium, iron, and 1 mg of folate supplement per day (for females capable of pregnancy).  [x]    Stressed the importance of regular exercise.   []    Substance Abuse: Discussed cessation/primary prevention of tobacco, alcohol, or other drug use; driving or other dangerous activities under the influence; availability of treatment for abuse.   [x]    Injury prevention: Discussed safety belts, safety helmets, smoke detector, smoking near  bedding or upholstery.   [x]    Sexuality: Discussed sexually transmitted diseases, partner selection, use of condoms, avoidance of unintended pregnancy  and contraceptive alternatives.  [x]    Dental health: Discussed importance of regular tooth brushing, flossing, and dental visits.  [x]    Health maintenance and immunizations reviewed. Please refer to Health maintenance section.    - CBC with Differential/Platelet - Comprehensive metabolic panel - Lipid panel - TSH  -dry eyes: thera tears during day and refresh PM at night.     Return in about 1 year (around 01/06/2020).     Orma Flaming, MD [place]  01/06/2019

## 2019-01-06 NOTE — Patient Instructions (Signed)
Preventive Care 25-25 Years Old, Female Preventive care refers to visits with your health care provider and lifestyle choices that can promote health and wellness. This includes:  A yearly physical exam. This may also be called an annual well check.  Regular dental visits and eye exams.  Immunizations.  Screening for certain conditions.  Healthy lifestyle choices, such as eating a healthy diet, getting regular exercise, not using drugs or products that contain nicotine and tobacco, and limiting alcohol use. What can I expect for my preventive care visit? Physical exam Your health care provider will check your:  Height and weight. This may be used to calculate body mass index (BMI), which tells if you are at a healthy weight.  Heart rate and blood pressure.  Skin for abnormal spots. Counseling Your health care provider may ask you questions about your:  Alcohol, tobacco, and drug use.  Emotional well-being.  Home and relationship well-being.  Sexual activity.  Eating habits.  Work and work Statistician.  Method of birth control.  Menstrual cycle.  Pregnancy history. What immunizations do I need?  Influenza (flu) vaccine  This is recommended every year. Tetanus, diphtheria, and pertussis (Tdap) vaccine  You may need a Td booster every 10 years. Varicella (chickenpox) vaccine  You may need this if you have not been vaccinated. Human papillomavirus (HPV) vaccine  If recommended by your health care provider, you may need three doses over 6 months. Measles, mumps, and rubella (MMR) vaccine  You may need at least one dose of MMR. You may also need a second dose. Meningococcal conjugate (MenACWY) vaccine  One dose is recommended if you are age 25-21 years and a first-year college student living in a residence hall, or if you have one of several medical conditions. You may also need additional booster doses. Pneumococcal conjugate (PCV13) vaccine  You may need  this if you have certain conditions and were not previously vaccinated. Pneumococcal polysaccharide (PPSV23) vaccine  You may need one or two doses if you smoke cigarettes or if you have certain conditions. Hepatitis A vaccine  You may need this if you have certain conditions or if you travel or work in places where you may be exposed to hepatitis A. Hepatitis B vaccine  You may need this if you have certain conditions or if you travel or work in places where you may be exposed to hepatitis B. Haemophilus influenzae type b (Hib) vaccine  You may need this if you have certain conditions. You may receive vaccines as individual doses or as more than one vaccine together in one shot (combination vaccines). Talk with your health care provider about the risks and benefits of combination vaccines. What tests do I need?  Blood tests  Lipid and cholesterol levels. These may be checked every 5 years starting at age 25.  Hepatitis C test.  Hepatitis B test. Screening  Diabetes screening. This is done by checking your blood sugar (glucose) after you have not eaten for a while (fasting).  Sexually transmitted disease (STD) testing.  BRCA-related cancer screening. This may be done if you have a family history of breast, ovarian, tubal, or peritoneal cancers.  Pelvic exam and Pap test. This may be done every 3 years starting at age 25. Starting at age 5, this may be done every 5 years if you have a Pap test in combination with an HPV test. Talk with your health care provider about your test results, treatment options, and if necessary, the need for more tests.  Follow these instructions at home: Eating and drinking   Eat a diet that includes fresh fruits and vegetables, whole grains, lean protein, and low-fat dairy.  Take vitamin and mineral supplements as recommended by your health care provider.  Do not drink alcohol if: ? Your health care provider tells you not to drink. ? You are  pregnant, may be pregnant, or are planning to become pregnant.  If you drink alcohol: ? Limit how much you have to 0-1 drink a day. ? Be aware of how much alcohol is in your drink. In the [place] one drink equals one 12 oz bottle of beer (355 mL), one 5 oz glass of wine (148 mL), or one 1 oz glass of hard liquor (44 mL). Lifestyle  Take daily care of your teeth and gums.  Stay active. Exercise for at least 30 minutes on 5 or more days each week.  Do not use any products that contain nicotine or tobacco, such as cigarettes, e-cigarettes, and chewing tobacco. If you need help quitting, ask your health care provider.  If you are sexually active, practice safe sex. Use a condom or other form of birth control (contraception) in order to prevent pregnancy and STIs (sexually transmitted infections). If you plan to become pregnant, see your health care provider for a preconception visit. What's next?  Visit your health care provider once a year for a well check visit.  Ask your health care provider how often you should have your eyes and teeth checked.  Stay up to date on all vaccines. This information is not intended to replace advice given to you by your health care provider. Make sure you discuss any questions you have with your health care provider. Document Released: 07/07/2001 Document Revised: 01/20/2018 Document Reviewed: 01/20/2018 Elsevier Patient Education  2020 Elsevier Inc.  

## 2019-01-09 ENCOUNTER — Encounter: Payer: Self-pay | Admitting: Family Medicine

## 2019-01-09 ENCOUNTER — Telehealth: Payer: Self-pay

## 2019-01-09 NOTE — Telephone Encounter (Signed)
Ready. See lab result note as well.

## 2019-01-09 NOTE — Telephone Encounter (Signed)
Copied from [place] [id number]. Topic: General - Inquiry >> Jan 09, 2019  9:38 AM Scherrie Gerlach wrote: Reason for CRM: pt wants to know if te paperwork she left on Friday is ready for pick up?

## 2019-01-09 NOTE — Telephone Encounter (Signed)
Spoke to patient and notified that ppw is ready for pick up.

## 2019-01-18 ENCOUNTER — Ambulatory Visit (INDEPENDENT_AMBULATORY_CARE_PROVIDER_SITE_OTHER): Payer: 59 | Admitting: Psychology

## 2019-01-18 DIAGNOSIS — Z634 Disappearance and death of family member: Secondary | ICD-10-CM | POA: Diagnosis not present

## 2019-01-18 DIAGNOSIS — F4323 Adjustment disorder with mixed anxiety and depressed mood: Secondary | ICD-10-CM | POA: Diagnosis not present

## 2019-01-31 ENCOUNTER — Ambulatory Visit (INDEPENDENT_AMBULATORY_CARE_PROVIDER_SITE_OTHER): Payer: 59 | Admitting: Psychology

## 2019-01-31 DIAGNOSIS — F4323 Adjustment disorder with mixed anxiety and depressed mood: Secondary | ICD-10-CM | POA: Diagnosis not present

## 2019-01-31 DIAGNOSIS — Z634 Disappearance and death of family member: Secondary | ICD-10-CM | POA: Diagnosis not present

## 2019-02-01 ENCOUNTER — Ambulatory Visit: Payer: 59 | Admitting: Psychology

## 2019-02-09 ENCOUNTER — Encounter: Payer: Self-pay | Admitting: Family Medicine

## 2019-02-10 ENCOUNTER — Ambulatory Visit (INDEPENDENT_AMBULATORY_CARE_PROVIDER_SITE_OTHER): Payer: 59 | Admitting: Psychology

## 2019-02-10 DIAGNOSIS — Z634 Disappearance and death of family member: Secondary | ICD-10-CM | POA: Diagnosis not present

## 2019-02-10 DIAGNOSIS — F4323 Adjustment disorder with mixed anxiety and depressed mood: Secondary | ICD-10-CM

## 2019-02-24 ENCOUNTER — Ambulatory Visit (INDEPENDENT_AMBULATORY_CARE_PROVIDER_SITE_OTHER): Payer: 59 | Admitting: Psychology

## 2019-02-24 DIAGNOSIS — F4323 Adjustment disorder with mixed anxiety and depressed mood: Secondary | ICD-10-CM | POA: Diagnosis not present

## 2019-02-24 DIAGNOSIS — Z634 Disappearance and death of family member: Secondary | ICD-10-CM | POA: Diagnosis not present

## 2019-03-03 ENCOUNTER — Ambulatory Visit (INDEPENDENT_AMBULATORY_CARE_PROVIDER_SITE_OTHER): Payer: 59

## 2019-03-03 ENCOUNTER — Encounter: Payer: Self-pay | Admitting: Family Medicine

## 2019-03-03 ENCOUNTER — Other Ambulatory Visit: Payer: Self-pay

## 2019-03-03 DIAGNOSIS — Z23 Encounter for immunization: Secondary | ICD-10-CM

## 2019-03-10 ENCOUNTER — Ambulatory Visit: Payer: 59 | Admitting: Psychology

## 2019-03-13 ENCOUNTER — Ambulatory Visit: Payer: 59 | Admitting: Psychology

## 2019-04-05 ENCOUNTER — Ambulatory Visit (INDEPENDENT_AMBULATORY_CARE_PROVIDER_SITE_OTHER): Payer: 59 | Admitting: Psychology

## 2019-04-05 DIAGNOSIS — F4323 Adjustment disorder with mixed anxiety and depressed mood: Secondary | ICD-10-CM | POA: Diagnosis not present

## 2019-04-05 DIAGNOSIS — Z634 Disappearance and death of family member: Secondary | ICD-10-CM

## 2019-04-07 ENCOUNTER — Ambulatory Visit (INDEPENDENT_AMBULATORY_CARE_PROVIDER_SITE_OTHER): Payer: 59 | Admitting: Psychology

## 2019-04-07 DIAGNOSIS — Z634 Disappearance and death of family member: Secondary | ICD-10-CM

## 2019-04-07 DIAGNOSIS — F4323 Adjustment disorder with mixed anxiety and depressed mood: Secondary | ICD-10-CM | POA: Diagnosis not present

## 2019-04-19 ENCOUNTER — Ambulatory Visit (INDEPENDENT_AMBULATORY_CARE_PROVIDER_SITE_OTHER): Payer: 59 | Admitting: Psychology

## 2019-04-19 DIAGNOSIS — F4323 Adjustment disorder with mixed anxiety and depressed mood: Secondary | ICD-10-CM

## 2019-04-19 DIAGNOSIS — Z634 Disappearance and death of family member: Secondary | ICD-10-CM

## 2019-05-03 ENCOUNTER — Ambulatory Visit (INDEPENDENT_AMBULATORY_CARE_PROVIDER_SITE_OTHER): Payer: 59 | Admitting: Psychology

## 2019-05-03 DIAGNOSIS — Z634 Disappearance and death of family member: Secondary | ICD-10-CM

## 2019-05-03 DIAGNOSIS — F4323 Adjustment disorder with mixed anxiety and depressed mood: Secondary | ICD-10-CM

## 2019-05-17 ENCOUNTER — Ambulatory Visit (INDEPENDENT_AMBULATORY_CARE_PROVIDER_SITE_OTHER): Payer: 59 | Admitting: Psychology

## 2019-05-17 DIAGNOSIS — F4323 Adjustment disorder with mixed anxiety and depressed mood: Secondary | ICD-10-CM | POA: Diagnosis not present

## 2019-05-17 DIAGNOSIS — Z634 Disappearance and death of family member: Secondary | ICD-10-CM

## 2019-05-31 ENCOUNTER — Telehealth: Payer: 59 | Admitting: Nurse Practitioner

## 2019-05-31 DIAGNOSIS — Z20822 Contact with and (suspected) exposure to covid-19: Secondary | ICD-10-CM

## 2019-05-31 DIAGNOSIS — J029 Acute pharyngitis, unspecified: Secondary | ICD-10-CM

## 2019-05-31 DIAGNOSIS — R0981 Nasal congestion: Secondary | ICD-10-CM

## 2019-05-31 DIAGNOSIS — R519 Headache, unspecified: Secondary | ICD-10-CM

## 2019-05-31 NOTE — Addendum Note (Signed)
Addended by: Chevis Pretty on: 05/31/2019 12:19 PM   Modules accepted: Orders

## 2019-05-31 NOTE — Progress Notes (Signed)
E-Visit for Corona Virus Screening   Your current symptoms could be consistent with the coronavirus.  Many health care providers can now test patients at their office but not all are.  [place] has multiple testing sites. For information on our [place] testing locations and hours go to [email or web address]  We are enrolling you in our [place] for [place] . Daily you will receive a questionnaire within the [place] website. Our COVID 19 response team will be monitoring your responses daily.  Testing Information: The COVID-19 Community Testing sites will begin testing BY APPOINTMENT ONLY.  You can schedule online at [email or web address]  If you do not have access to a smart phone or computer you may call [phone] for an appointment.  Testing Locations: Appointment schedule is 8 am to 3:30 pm at all sites  [facility] indoors at [address], [place] [address] [facility]  indoors at [place]. [address] [place], [place] [address] [place] indoors at [address] [place] [address]  Additional testing sites in the Community:  . For [facility] Testing sites in [facility]  FaceUpdate.uy  . For Pop-up testing sites in [place]  BowlDirectory.co.uk  . For Testing sites with regular hours [email or web address]  . For [place] [email or web address]  . For [facility] BasicJet.[email or web address]  . For [facility] testing in [place] and [employer] [email or web address]  . For [facility] testing in [facility]   [email or web address]  For  more  information about community testing call [phone]   We are enrolling you in our [place] for [place] . Daily you will receive a questionnaire within the [place] website. Our COVID 19 response team will be monitoring your responses daily.  Please quarantine yourself while awaiting your test results. If you develop fever/cough/breathlessness, please stay home for 10 days with improving symptoms and until you have had 24 hours of no fever (without taking a fever reducer).  You should wear a mask or cloth face covering over your nose and mouth if you must be around other people or animals, including pets (even at home). Try to stay at least 6 feet away from other people. This will protect the people around you.  Please continue good preventive care measures, including:  frequent hand-washing, avoid touching your face, cover coughs/sneezes, stay out of crowds and keep a 6 foot distance from others.  COVID-19 is a respiratory illness with symptoms that are similar to the flu. Symptoms are typically mild to moderate, but there have been cases of severe illness and death due to the virus.   The following symptoms may appear 2-14 days after exposure: . Fever . Cough . Shortness of breath or difficulty breathing . Chills . Repeated shaking with chills . Muscle pain . Headache . Sore throat . New loss of taste or smell . Fatigue . Congestion or runny nose . Nausea or vomiting . Diarrhea  Go to the nearest hospital ED for assessment if fever/cough/breathlessness are severe or illness seems like a threat to life.  It is vitally important that if you feel that you have an infection such as this virus or any other virus that you stay home and away from places where you may spread it to others.  You should avoid contact with people age 12 and older.   You can use medication such as delsym if develop a cough  You may also take acetaminophen (Tylenol) as needed for fever.  Reduce your  risk of any infection by using the same precautions used for avoiding the common cold or flu:  [place] Kitchen Wash your hands often with soap and warm water for at least 20 seconds.  If soap and water are not readily available, use an alcohol-based hand sanitizer with at least 60% alcohol.  . If coughing or sneezing, cover your mouth and nose by coughing or sneezing into the elbow areas of your shirt or coat, into a tissue or into your sleeve (not your hands). . Avoid shaking hands with others and consider head nods or verbal greetings only. . Avoid touching your eyes, nose, or mouth with unwashed hands.  . Avoid close contact with people who are sick. . Avoid places or events with large numbers of people in one location, like concerts or sporting events. . Carefully consider travel plans you have or are making. . If you are planning any travel outside or inside the [place], visit the CDC's Travelers' Health webpage for the latest health notices. . If you have some symptoms but not all symptoms, continue to monitor at home and seek medical attention if your symptoms worsen. . If you are having a medical emergency, call 911.  HOME CARE . Only take medications as instructed by your medical team. . Drink plenty of fluids and get plenty of rest. . A steam or ultrasonic humidifier can help if you have congestion.   GET HELP RIGHT AWAY IF YOU HAVE EMERGENCY WARNING SIGNS** FOR COVID-19. If you or someone is showing any of these signs seek emergency medical care immediately. Call 911 or proceed to your closest emergency facility if: . You develop worsening high fever. . Trouble breathing . Bluish lips or face . Persistent pain or pressure in the chest . New confusion . Inability to wake or stay awake . You cough up blood. . Your symptoms become more severe  **This list is not all possible symptoms. Contact your medical provider for any symptoms that are sever or concerning to you.  MAKE SURE YOU   Understand  these instructions.  Will watch your condition.  Will get help right away if you are not doing well or get worse.  Your e-visit answers were reviewed by a board certified advanced clinical practitioner to complete your personal care plan.  Depending on the condition, your plan could have included both over the counter or prescription medications.  If there is a problem please reply once you have received a response from your provider.  Your safety is important to [place].  If you have drug allergies check your prescription carefully.    You can use MyChart to ask questions about today's visit, request a non-urgent call back, or ask for a work or school excuse for 24 hours related to this e-Visit. If it has been greater than 24 hours you will need to follow up with your provider, or enter a new e-Visit to address those concerns. You will get an e-mail in the next two days asking about your experience.  I hope that your e-visit has been valuable and will speed your recovery. Thank you for using e-visits.   5-10 minutes spent reviewing and documenting in chart.

## 2019-06-01 ENCOUNTER — Ambulatory Visit: Payer: 59 | Attending: Internal Medicine

## 2019-06-01 DIAGNOSIS — Z20822 Contact with and (suspected) exposure to covid-19: Secondary | ICD-10-CM

## 2019-06-03 LAB — NOVEL CORONAVIRUS, NAA: SARS-CoV-2, NAA: NOT DETECTED

## 2019-06-05 ENCOUNTER — Ambulatory Visit (INDEPENDENT_AMBULATORY_CARE_PROVIDER_SITE_OTHER): Payer: 59 | Admitting: Psychology

## 2019-06-05 DIAGNOSIS — F4323 Adjustment disorder with mixed anxiety and depressed mood: Secondary | ICD-10-CM

## 2019-06-05 DIAGNOSIS — Z634 Disappearance and death of family member: Secondary | ICD-10-CM | POA: Diagnosis not present

## 2019-06-19 ENCOUNTER — Ambulatory Visit (INDEPENDENT_AMBULATORY_CARE_PROVIDER_SITE_OTHER): Payer: 59 | Admitting: Psychology

## 2019-06-19 DIAGNOSIS — Z634 Disappearance and death of family member: Secondary | ICD-10-CM

## 2019-06-19 DIAGNOSIS — F4323 Adjustment disorder with mixed anxiety and depressed mood: Secondary | ICD-10-CM | POA: Diagnosis not present

## 2019-06-22 ENCOUNTER — Encounter: Payer: Self-pay | Admitting: Family Medicine

## 2019-07-03 ENCOUNTER — Ambulatory Visit (INDEPENDENT_AMBULATORY_CARE_PROVIDER_SITE_OTHER): Payer: 59 | Admitting: Psychology

## 2019-07-03 DIAGNOSIS — F4323 Adjustment disorder with mixed anxiety and depressed mood: Secondary | ICD-10-CM | POA: Diagnosis not present

## 2019-07-03 DIAGNOSIS — Z634 Disappearance and death of family member: Secondary | ICD-10-CM

## 2019-07-17 ENCOUNTER — Ambulatory Visit (INDEPENDENT_AMBULATORY_CARE_PROVIDER_SITE_OTHER): Payer: 59 | Admitting: Psychology

## 2019-07-17 DIAGNOSIS — Z634 Disappearance and death of family member: Secondary | ICD-10-CM

## 2019-07-17 DIAGNOSIS — F4323 Adjustment disorder with mixed anxiety and depressed mood: Secondary | ICD-10-CM | POA: Diagnosis not present

## 2019-07-25 ENCOUNTER — Telehealth: Payer: Self-pay | Admitting: Family Medicine

## 2019-07-25 ENCOUNTER — Other Ambulatory Visit: Payer: Self-pay

## 2019-07-25 ENCOUNTER — Encounter (HOSPITAL_COMMUNITY): Payer: Self-pay

## 2019-07-25 ENCOUNTER — Ambulatory Visit (HOSPITAL_COMMUNITY)
Admission: EM | Admit: 2019-07-25 | Discharge: 2019-07-25 | Disposition: A | Discharge status: Home / Self Care | Payer: 59 | Attending: Family Medicine | Admitting: Family Medicine

## 2019-07-25 DIAGNOSIS — Z3202 Encounter for pregnancy test, result negative: Secondary | ICD-10-CM

## 2019-07-25 DIAGNOSIS — R1032 Left lower quadrant pain: Secondary | ICD-10-CM | POA: Insufficient documentation

## 2019-07-25 LAB — CBC
HCT: 44.8 % (ref 36.0–46.0)
Hemoglobin: 14.8 g/dL (ref 12.0–15.0)
MCH: 28.1 pg (ref 26.0–34.0)
MCHC: 33 g/dL (ref 30.0–36.0)
MCV: 85.2 fL (ref 80.0–100.0)
Platelets: 247 10*3/uL (ref 150–400)
RBC: 5.26 MIL/uL — ABNORMAL HIGH (ref 3.87–5.11)
RDW: 12.1 % (ref 11.5–15.5)
WBC: 8.9 10*3/uL (ref 4.0–10.5)
nRBC: 0 % (ref 0.0–0.2)

## 2019-07-25 LAB — COMPREHENSIVE METABOLIC PANEL
ALT: 17 U/L (ref 0–44)
AST: 23 U/L (ref 15–41)
Albumin: 4.5 g/dL (ref 3.5–5.0)
Alkaline Phosphatase: 72 U/L (ref 38–126)
Anion gap: 9 (ref 5–15)
BUN: 6 mg/dL (ref 6–20)
CO2: 24 mmol/L (ref 22–32)
Calcium: 9.3 mg/dL (ref 8.9–10.3)
Chloride: 104 mmol/L (ref 98–111)
Creatinine, Ser: 0.65 mg/dL (ref 0.44–1.00)
GFR calc Af Amer: >60 mL/min (ref >60)
GFR calc non Af Amer: >60 mL/min (ref >60)
Glucose, Bld: 93 mg/dL (ref 70–99)
Potassium: 4.2 mmol/L (ref 3.5–5.1)
Sodium: 137 mmol/L (ref 135–145)
Total Bilirubin: 0.9 mg/dL (ref 0.3–1.2)
Total Protein: 7.3 g/dL (ref 6.5–8.1)

## 2019-07-25 LAB — POCT URINALYSIS DIP (DEVICE)
Bilirubin Urine: NEGATIVE
Glucose, UA: NEGATIVE mg/dL
Hgb urine dipstick: NEGATIVE
Ketones, ur: NEGATIVE mg/dL
Leukocytes,Ua: NEGATIVE
Nitrite: NEGATIVE
Protein, ur: NEGATIVE mg/dL
Specific Gravity, Urine: 1.015 (ref 1.005–1.030)
Urobilinogen, UA: 0.2 mg/dL (ref 0.0–1.0)
pH: 7.5 (ref 5.0–8.0)

## 2019-07-25 LAB — POC URINE PREG, ED: Preg Test, Ur: NEGATIVE

## 2019-07-25 LAB — POCT PREGNANCY, URINE: Preg Test, Ur: NEGATIVE

## 2019-07-25 LAB — LIPASE, BLOOD: Lipase: 23 U/L (ref 11–51)

## 2019-07-25 MED ORDER — NAPROXEN 500 MG PO TABS: 500.0000 mg | ORAL_TABLET | Freq: Two times a day (BID) | ORAL | 0 refills | Status: DC

## 2019-07-25 NOTE — Telephone Encounter (Signed)
FYI

## 2019-07-25 NOTE — Telephone Encounter (Signed)
Patient spoke with [facility] today at 11:30am.  Stated that she was having left abdominal pain on the lower side.  States she is having sharp pain when she is breathing in.  States pain is a consistent 4 and intensified with a BM movement this morning.   Patient was advised to be seen within 4 hours. We have advised patient to go to UC.

## 2019-07-25 NOTE — ED Triage Notes (Signed)
Pt present abdominal pain with nausea, symptom started last night. Pt states the pain is located on the LUQ area of her stomach.

## 2019-07-25 NOTE — Discharge Instructions (Signed)
Urine normal, pregnancy negative Blood work pending- I will call with results I am suspicious of an ovarian cyst causing this pain Use naprosyn twice daily with food for pain If manageable and persisting follow up with OBGYN/primary care for follow  If worsening, developing fevers, follow up in emergency room for imaging.

## 2019-07-26 ENCOUNTER — Ambulatory Visit: Payer: 59 | Admitting: Family Medicine

## 2019-07-26 NOTE — ED Provider Notes (Signed)
[facility]    CSN: [id number] Arrival date & time: 07/25/19  1438      History   Chief Complaint Chief Complaint  Patient presents with  . Abdominal Pain  . Nausea    HPI April Gibbs is a 24 y.o. female no significant past medical history presenting today for evaluation of abdominal pain.Patient began to develop left lower quadrant abdominal pain beginning last night around 7 pm after she ate dinner. Pain has been unrelenting since but does worsen with certain position changes.  Describes pain as sharp.  Has had some associated nausea with her symptoms.  She denies diarrhea.  Bowels have been normal.  Typically has daily bowel movement in the morning.  Had normal bowel movement this morning was normal consistency and amount.  Denies significant straining with bowels.  She denies any pelvic symptoms of abnormal discharge itching, irritation, irregular bleeding.  Denies dysuria, increased frequency or urgency.  Denies history of ovarian cyst.  She does report that her paternal aunt has history of ovarian cancer.  She is not currently on birth control as she is attempting to get pregnant.  She has been trying since December.  Last menstrual cycle was 06/23/2019.  She denies chest pain or shortness of breath.  Pain does increase with deep inspiration.  Denies history of GI issues. Has felt feverish/warm.  HPI  Past Medical History:  Diagnosis Date  . Anemia   . Chicken pox   . Frequent headaches   . Migraines     There are no problems to display for this patient.   Past Surgical History:  Procedure Laterality Date  . WISDOM TOOTH EXTRACTION  2013    OB History   No obstetric history on file.      Home Medications    Prior to Admission medications   Medication Sig Start Date End Date Taking? Authorizing Provider  loratadine (CLARITIN) 10 MG tablet Take 10 mg by mouth daily as needed.     [provider]  naproxen (NAPROSYN) 500 MG tablet Take 1  tablet (500 mg total) by mouth 2 (two) times daily. 07/25/19   Jennier Schissler, Elesa Hacker, PA-C  TRI-LO-MARZIA 0.18/0.215/0.25 MG-25 MCG tab  11/17/18   [provider]    Family History Family History  Problem Relation Age of Onset  . Miscarriages / [place] Mother   . Alcohol abuse Father   . Depression Father   . Diabetes Father   . Heart disease Father   . Hypertension Father   . Liver disease Father        fatty liver  . Learning disabilities Brother   . Birth defects Maternal Grandmother   . Asthma Maternal Grandmother   . Esophageal cancer Maternal Grandmother   . Diabetes Maternal Grandfather   . Hyperlipidemia Maternal Grandfather   . Hypertension Maternal Grandfather   . Birth defects Maternal Grandfather   . Arthritis Paternal Grandmother   . Asthma Paternal Grandmother   . Diabetes Paternal Grandmother   . Hyperlipidemia Paternal Grandmother   . Hypertension Paternal Grandmother   . Miscarriages / Stillbirths Paternal Grandmother   . Early death Paternal Grandfather   . Hypertension Paternal Grandfather   . Colon cancer Paternal Grandfather   . Ovarian cancer Paternal Aunt     Social History Social History   Tobacco Use  . Smoking status: Never Smoker  . Smokeless tobacco: Never Used  Substance Use Topics  . Alcohol use: Yes    Comment: maybe  2 a month  . Drug use: No     Allergies   Patient has no known allergies.   Review of Systems Review of Systems  Constitutional: Negative for fever.  Respiratory: Negative for shortness of breath.   Cardiovascular: Negative for chest pain.  Gastrointestinal: Positive for abdominal pain and nausea. Negative for diarrhea and vomiting.  Genitourinary: Negative for dysuria, flank pain, genital sores, hematuria, menstrual problem, vaginal bleeding, vaginal discharge and vaginal pain.  Musculoskeletal: Negative for back pain.  Skin: Negative for rash.  Neurological: Negative for dizziness, light-headedness and  headaches.     Physical Exam Triage Vital Signs ED Triage Vitals  Enc Vitals Group     BP 07/25/19 1520 112/72     Pulse Rate 07/25/19 1520 65     Resp 07/25/19 1520 16     Temp 07/25/19 1520 99.2 F (37.3 C)     Temp Source 07/25/19 1520 Oral     SpO2 07/25/19 1520 100 %     Weight --      Height --      Head Circumference --      Peak Flow --      Pain Score 07/25/19 1518 4     Pain Loc --      Pain Edu? --      Excl. in [place]? --    No data found.  Updated Vital Signs BP 112/72 (BP Location: Right Arm)   Pulse 65   Temp 99.2 F (37.3 C) (Oral)   Resp 16   LMP 06/23/2019   SpO2 100%   Visual Acuity Right Eye Distance:   Left Eye Distance:   Bilateral Distance:    Right Eye Near:   Left Eye Near:    Bilateral Near:     Physical Exam Vitals and nursing note reviewed.  Constitutional:      General: She is not in acute distress.    Appearance: She is well-developed.  HENT:     Head: Normocephalic and atraumatic.  Eyes:     Conjunctiva/sclera: Conjunctivae normal.  Cardiovascular:     Rate and Rhythm: Normal rate and regular rhythm.     Heart sounds: No murmur.  Pulmonary:     Effort: Pulmonary effort is normal. No respiratory distress.     Breath sounds: Normal breath sounds.     Comments: Breathing comfortably at rest, CTABL, no wheezing, rales or other adventitious sounds auscultated  Abdominal:     Palpations: Abdomen is soft.     Tenderness: There is abdominal tenderness.     Comments: Soft, nondistended, nontender to palpation to right upper and lower quadrants, nontender in epigastrium, mild tenderness in left upper quadrant, increased tenderness in left lower quadrant, negative rebound, negative Rovsing, negative McBurney, pain mildly extends towards left flank, does not extend into inguinal area.  No CVA tenderness  Musculoskeletal:     Cervical back: Neck supple.  Skin:    General: Skin is warm and dry.  Neurological:     Mental Status: She  is alert.      UC Treatments / Results  Labs (all labs ordered are listed, but only abnormal results are displayed) Labs Reviewed  CBC - Abnormal; Notable for the following components:      Result Value   RBC 5.26 (*)    All other components within normal limits  COMPREHENSIVE METABOLIC PANEL  LIPASE, BLOOD  POCT PREGNANCY, URINE  POC URINE PREG, ED  POCT URINALYSIS DIP (DEVICE)  EKG   Radiology No results found.  Procedures Procedures (including critical care time)  Medications Ordered in UC Medications - No data to display  Initial Impression / Assessment and Plan / UC Course  I have reviewed the triage vital signs and the nursing notes.  Pertinent labs & imaging results that were available during my care of the patient were reviewed by me and considered in my medical decision making (see chart for details).     1.  Left lower quadrant abdominal pain Pregnancy test negative, UA unremarkable Unclear etiology at this time, given location and history, feel constipation less likely, discussed with patient possible etiologies of ovarian cyst/rupture versus other pelvic causes.  Given patient's age would not suspect diverticulitis, but cannot rule out other colitis at this time.  Vital signs stable without fever or tachycardia, no hypoxia.  Will treat with Naprosyn to help with the pain, recommended outpatient follow-up with OB/GYN/PCP for further imaging if able to set up close follow-up.  Advised patient if she is having worsening pain to follow-up in the emergency room.  Checking basic labs of CBC, CMP and lipase to further evaluate abdominal labs, if abnormal will call and have patient go to emergency room.  Discussed strict return precautions. Patient verbalized understanding and is agreeable with plan.  Final Clinical Impressions(s) / UC Diagnoses   Final diagnoses:  Left lower quadrant abdominal pain     Discharge Instructions     Urine normal, pregnancy  negative Blood work pending- I will call with results I am suspicious of an ovarian cyst causing this pain Use naprosyn twice daily with food for pain If manageable and persisting follow up with OBGYN/primary care for follow  If worsening, developing fevers, follow up in emergency room for imaging.   ED Prescriptions    Medication Sig Dispense Auth. Provider   naproxen (NAPROSYN) 500 MG tablet Take 1 tablet (500 mg total) by mouth 2 (two) times daily. 30 tablet Damarri Rampy, [place] C, PA-C     PDMP not reviewed this encounter.   Janith Lima, PA-C 07/26/19 1014

## 2019-07-28 ENCOUNTER — Other Ambulatory Visit: Payer: Self-pay | Admitting: Obstetrics & Gynecology

## 2019-07-28 DIAGNOSIS — R1032 Left lower quadrant pain: Secondary | ICD-10-CM

## 2019-08-08 ENCOUNTER — Other Ambulatory Visit: Payer: 59

## 2019-08-09 ENCOUNTER — Ambulatory Visit (INDEPENDENT_AMBULATORY_CARE_PROVIDER_SITE_OTHER): Payer: 59 | Admitting: Psychology

## 2019-08-09 DIAGNOSIS — F4323 Adjustment disorder with mixed anxiety and depressed mood: Secondary | ICD-10-CM

## 2019-08-09 DIAGNOSIS — Z634 Disappearance and death of family member: Secondary | ICD-10-CM

## 2019-08-11 ENCOUNTER — Ambulatory Visit
Admission: RE | Admit: 2019-08-11 | Discharge: 2019-08-11 | Disposition: A | Discharge status: Home / Self Care | Payer: 59 | Source: Ambulatory Visit | Attending: Obstetrics & Gynecology | Admitting: Obstetrics & Gynecology

## 2019-08-11 DIAGNOSIS — R1032 Left lower quadrant pain: Secondary | ICD-10-CM

## 2019-08-23 ENCOUNTER — Ambulatory Visit (INDEPENDENT_AMBULATORY_CARE_PROVIDER_SITE_OTHER): Payer: 59 | Admitting: Psychology

## 2019-08-23 ENCOUNTER — Ambulatory Visit: Payer: 59 | Admitting: Psychology

## 2019-08-23 DIAGNOSIS — Z634 Disappearance and death of family member: Secondary | ICD-10-CM

## 2019-08-23 DIAGNOSIS — F4321 Adjustment disorder with depressed mood: Secondary | ICD-10-CM

## 2019-09-06 ENCOUNTER — Ambulatory Visit (INDEPENDENT_AMBULATORY_CARE_PROVIDER_SITE_OTHER): Payer: 59 | Admitting: Psychology

## 2019-09-06 DIAGNOSIS — Z634 Disappearance and death of family member: Secondary | ICD-10-CM | POA: Diagnosis not present

## 2019-09-06 DIAGNOSIS — F4323 Adjustment disorder with mixed anxiety and depressed mood: Secondary | ICD-10-CM

## 2019-09-20 ENCOUNTER — Ambulatory Visit (INDEPENDENT_AMBULATORY_CARE_PROVIDER_SITE_OTHER): Payer: 59 | Admitting: Psychology

## 2019-09-20 DIAGNOSIS — Z634 Disappearance and death of family member: Secondary | ICD-10-CM

## 2019-09-20 DIAGNOSIS — F4323 Adjustment disorder with mixed anxiety and depressed mood: Secondary | ICD-10-CM | POA: Diagnosis not present

## 2019-10-04 ENCOUNTER — Ambulatory Visit (INDEPENDENT_AMBULATORY_CARE_PROVIDER_SITE_OTHER): Payer: 59 | Admitting: Psychology

## 2019-10-04 DIAGNOSIS — Z634 Disappearance and death of family member: Secondary | ICD-10-CM | POA: Diagnosis not present

## 2019-10-04 DIAGNOSIS — F4323 Adjustment disorder with mixed anxiety and depressed mood: Secondary | ICD-10-CM | POA: Diagnosis not present

## 2019-10-19 ENCOUNTER — Ambulatory Visit (INDEPENDENT_AMBULATORY_CARE_PROVIDER_SITE_OTHER): Payer: 59 | Admitting: Psychology

## 2019-10-19 DIAGNOSIS — F4323 Adjustment disorder with mixed anxiety and depressed mood: Secondary | ICD-10-CM

## 2019-10-19 DIAGNOSIS — Z634 Disappearance and death of family member: Secondary | ICD-10-CM

## 2019-11-02 ENCOUNTER — Ambulatory Visit (INDEPENDENT_AMBULATORY_CARE_PROVIDER_SITE_OTHER): Payer: 59 | Admitting: Psychology

## 2019-11-02 DIAGNOSIS — Z634 Disappearance and death of family member: Secondary | ICD-10-CM | POA: Diagnosis not present

## 2019-11-02 DIAGNOSIS — F4323 Adjustment disorder with mixed anxiety and depressed mood: Secondary | ICD-10-CM | POA: Diagnosis not present

## 2019-11-20 ENCOUNTER — Ambulatory Visit (INDEPENDENT_AMBULATORY_CARE_PROVIDER_SITE_OTHER): Payer: 59 | Admitting: Psychology

## 2019-11-20 DIAGNOSIS — F064 Anxiety disorder due to known physiological condition: Secondary | ICD-10-CM

## 2019-12-14 ENCOUNTER — Ambulatory Visit (INDEPENDENT_AMBULATORY_CARE_PROVIDER_SITE_OTHER): Payer: 59 | Admitting: Psychology

## 2019-12-14 DIAGNOSIS — F064 Anxiety disorder due to known physiological condition: Secondary | ICD-10-CM | POA: Diagnosis not present

## 2020-01-08 ENCOUNTER — Other Ambulatory Visit: Payer: Self-pay

## 2020-01-08 ENCOUNTER — Ambulatory Visit (INDEPENDENT_AMBULATORY_CARE_PROVIDER_SITE_OTHER): Payer: 59 | Admitting: Family Medicine

## 2020-01-08 ENCOUNTER — Encounter: Payer: Self-pay | Admitting: Family Medicine

## 2020-01-08 VITALS — BP 100/50 | HR 88 | Temp 98.4°F | Ht 68.0 in | Wt 157.0 lb

## 2020-01-08 DIAGNOSIS — Z0000 Encounter for general adult medical examination without abnormal findings: Secondary | ICD-10-CM

## 2020-01-08 DIAGNOSIS — Z1159 Encounter for screening for other viral diseases: Secondary | ICD-10-CM | POA: Diagnosis not present

## 2020-01-08 DIAGNOSIS — H1013 Acute atopic conjunctivitis, bilateral: Secondary | ICD-10-CM

## 2020-01-08 DIAGNOSIS — Z114 Encounter for screening for human immunodeficiency virus [HIV]: Secondary | ICD-10-CM | POA: Diagnosis not present

## 2020-01-08 MED ORDER — PAZEO 0.7 % OP SOLN: 1.0000 [drp] | Freq: Every day | OPHTHALMIC | 1 refills | Status: DC

## 2020-01-08 NOTE — Progress Notes (Signed)
Patient: April Gibbs MRN: [id number] DOB: 1995/06/14 PCP: Orma Flaming, MD     Subjective:.   Chief Complaint  Patient presents with  . Annual Exam    HPI: The patient is a 24 y.o. female who presents today for annual exam. She denies any changes to past medical history. There has been no recent hospitalizations. She is following a well balanced diet and exercise plan. She does cardio, and lifts weights at least 4-5 times weekly. Weight has been stable. Has concerns about allergies. She complains of constant eye watering. She states uses an allergy eye drop from Optometrist.  She is requesting recommendations for allergy medications. She has been using pataday without relief.   No family history of colon or breast cancer.   Followed by derm yearly and gyn.   Immunization History  Administered Date(s) Administered  . Influenza,inj,Quad PF,6+ Mos 03/03/2019  . Influenza-Unspecified 02/26/2017, 02/22/2018  . Tdap 05/25/2016   Colonoscopy: n/a Mammogram: n/a Pap smear: 07/03/2014   Review of Systems  Constitutional: Negative for chills, fatigue and fever.  HENT: Negative for dental problem, ear pain, hearing loss and trouble swallowing.   Eyes: Positive for discharge (clear). Negative for pain, itching and visual disturbance.  Respiratory: Negative for cough, chest tightness and shortness of breath.   Cardiovascular: Negative for chest pain, palpitations and leg swelling.  Gastrointestinal: Negative for abdominal pain, blood in stool, diarrhea and nausea.  Endocrine: Negative for cold intolerance, polydipsia, polyphagia and polyuria.  Genitourinary: Negative for dysuria, frequency, hematuria, pelvic pain and urgency.  Musculoskeletal: Negative for arthralgias.  Skin: Negative for rash.  Neurological: Negative for dizziness, light-headedness and headaches.  Psychiatric/Behavioral: Negative for dysphoric mood and sleep disturbance. The patient is not nervous/anxious.      Allergies Patient has No Known Allergies.  Past Medical History Patient  has a past medical history of Anemia, Chicken pox, Frequent headaches, and Migraines.  Surgical History Patient  has a past surgical history that includes Wisdom tooth extraction (2013).  Family History Pateint's family history includes Alcohol abuse in her father; Arthritis in her paternal grandmother; Asthma in her maternal grandmother and paternal grandmother; Birth defects in her maternal grandfather and maternal grandmother; Colon cancer in her paternal grandfather; Depression in her father; Diabetes in her father, maternal grandfather, and paternal grandmother; Early death in her paternal grandfather; Esophageal cancer in her maternal grandmother; Heart disease in her father; Hyperlipidemia in her maternal grandfather and paternal grandmother; Hypertension in her father, maternal grandfather, paternal grandfather, and paternal grandmother; Learning disabilities in her brother; Liver disease in her father; Miscarriages / Stillbirths in her mother and paternal grandmother; Ovarian cancer in her paternal aunt.  Social History Patient  reports that she has never smoked. She has never used smokeless tobacco. She reports current alcohol use. She reports that she does not use drugs.    Objective: Vitals:   01/08/20 1001  BP: (!) 100/50  Pulse: 88  Temp: 98.4 F (36.9 C)  TempSrc: Temporal  SpO2: 99%  Weight: 157 lb (71.2 kg)  Height: 5\' 8"  (1.727 m)    Body mass index is 23.87 kg/m.  Physical Exam Vitals reviewed.  Constitutional:      Appearance: Normal appearance. She is well-developed and normal weight.  HENT:     Head: Normocephalic and atraumatic.     Right Ear: Tympanic membrane, ear canal and external ear normal.     Left Ear: Tympanic membrane, ear canal and external ear normal.     Nose: Nose  normal.     Mouth/Throat:     Mouth: Mucous membranes are moist.  Eyes:     General:         Right eye: No discharge.        Left eye: No discharge.     Extraocular Movements: Extraocular movements intact.     Conjunctiva/sclera: Conjunctivae normal.     Pupils: Pupils are equal, round, and reactive to light.  Neck:     Thyroid: No thyromegaly.  Cardiovascular:     Rate and Rhythm: Normal rate and regular rhythm.     Pulses: Normal pulses.     Heart sounds: Normal heart sounds. No murmur heard.   Pulmonary:     Effort: Pulmonary effort is normal.     Breath sounds: Normal breath sounds.  Abdominal:     General: Bowel sounds are normal. There is no distension.     Palpations: Abdomen is soft.     Tenderness: There is no abdominal tenderness.  Musculoskeletal:        General: Normal range of motion.     Cervical back: Normal range of motion and neck supple.  Lymphadenopathy:     Cervical: No cervical adenopathy.  Skin:    General: Skin is warm and dry.     Capillary Refill: Capillary refill takes less than 2 seconds.     Findings: No rash.  Neurological:     General: No focal deficit present.     Mental Status: She is alert and oriented to person, place, and time.     Cranial Nerves: No cranial nerve deficit.     Coordination: Coordination normal.     Deep Tendon Reflexes: Reflexes normal.  Psychiatric:        Mood and Affect: Mood normal.        Behavior: Behavior normal.      Office Visit from 01/08/2020 in [place]  PHQ-2 Total Score 0         Assessment/plan: 1. Annual physical exam Routine fasting lab work today. Has paperwork to fill out as well for me after labs returned. On PNV due to trying to get pregnant. Discussed other pregnancy related safety tips. Continue healthy diet and exercise. F/u in one year or as needed.  Patient counseling [x]    Nutrition: Stressed importance of moderation in sodium/caffeine intake, saturated fat and cholesterol, caloric balance, sufficient intake of fresh fruits, vegetables, fiber, calcium,  iron, and 1 mg of folate supplement per day (for females capable of pregnancy).  [x]    Stressed the importance of regular exercise.   []    Substance Abuse: Discussed cessation/primary prevention of tobacco, alcohol, or other drug use; driving or other dangerous activities under the influence; availability of treatment for abuse.   [x]    Injury prevention: Discussed safety belts, safety helmets, smoke detector, smoking near bedding or upholstery.   [x]    Sexuality: Discussed sexually transmitted diseases, partner selection, use of condoms, avoidance of unintended pregnancy  and contraceptive alternatives.  [x]    Dental health: Discussed importance of regular tooth brushing, flossing, and dental visits.  [x]    Health maintenance and immunizations reviewed. Please refer to Health maintenance section.    - CBC with Differential/Platelet; Future - Comprehensive metabolic panel; Future - Lipid panel; Future - TSH; Future - TSH - Lipid panel - Comprehensive metabolic panel - CBC with Differential/Platelet  2. Encounter for screening for HIV  - HIV Antibody (routine testing w rflx); Future - HIV Antibody (routine testing  w rflx)  3. Encounter for hepatitis C screening test for low risk patient  - Hepatitis C antibody; Future - Hepatitis C antibody  4. Allergic conjunctivitis of both eyes Trial of pazeo and zyrtec.     This visit occurred during the SARS-CoV-2 public health emergency.  Safety protocols were in place, including screening questions prior to the visit, additional usage of staff PPE, and extensive cleaning of exam room while observing appropriate contact time as indicated for disinfecting solutions.     Return in about 1 year (around 01/07/2021) for annual .     Orma Flaming, MD [place]  01/08/2020

## 2020-01-08 NOTE — Patient Instructions (Addendum)
Zyrtec or allegra daily for allergies. Should help eyes as well.  pazeo eye drops daily Would get thera tears and drop frquently during day if eye strain from computer    Preventive Care 2-26 Years Old, Female Preventive care refers to visits with your health care provider and lifestyle choices that can promote health and wellness. This includes:  A yearly physical exam. This may also be called an annual well check.  Regular dental visits and eye exams.  Immunizations.  Screening for certain conditions.  Healthy lifestyle choices, such as eating a healthy diet, getting regular exercise, not using drugs or products that contain nicotine and tobacco, and limiting alcohol use. What can I expect for my preventive care visit? Physical exam Your health care provider will check your:  Height and weight. This may be used to calculate body mass index (BMI), which tells if you are at a healthy weight.  Heart rate and blood pressure.  Skin for abnormal spots. Counseling Your health care provider may ask you questions about your:  Alcohol, tobacco, and drug use.  Emotional well-being.  Home and relationship well-being.  Sexual activity.  Eating habits.  Work and work Statistician.  Method of birth control.  Menstrual cycle.  Pregnancy history. What immunizations do I need?  Influenza (flu) vaccine  This is recommended every year. Tetanus, diphtheria, and pertussis (Tdap) vaccine  You may need a Td booster every 10 years. Varicella (chickenpox) vaccine  You may need this if you have not been vaccinated. Human papillomavirus (HPV) vaccine  If recommended by your health care provider, you may need three doses over 6 months. Measles, mumps, and rubella (MMR) vaccine  You may need at least one dose of MMR. You may also need a second dose. Meningococcal conjugate (MenACWY) vaccine  One dose is recommended if you are age 55-21 years and a first-year college student  living in a residence hall, or if you have one of several medical conditions. You may also need additional booster doses. Pneumococcal conjugate (PCV13) vaccine  You may need this if you have certain conditions and were not previously vaccinated. Pneumococcal polysaccharide (PPSV23) vaccine  You may need one or two doses if you smoke cigarettes or if you have certain conditions. Hepatitis A vaccine  You may need this if you have certain conditions or if you travel or work in places where you may be exposed to hepatitis A. Hepatitis B vaccine  You may need this if you have certain conditions or if you travel or work in places where you may be exposed to hepatitis B. Haemophilus influenzae type b (Hib) vaccine  You may need this if you have certain conditions. You may receive vaccines as individual doses or as more than one vaccine together in one shot (combination vaccines). Talk with your health care provider about the risks and benefits of combination vaccines. What tests do I need?  Blood tests  Lipid and cholesterol levels. These may be checked every 5 years starting at age 48.  Hepatitis C test.  Hepatitis B test. Screening  Diabetes screening. This is done by checking your blood sugar (glucose) after you have not eaten for a while (fasting).  Sexually transmitted disease (STD) testing.  BRCA-related cancer screening. This may be done if you have a family history of breast, ovarian, tubal, or peritoneal cancers.  Pelvic exam and Pap test. This may be done every 3 years starting at age 1. Starting at age 47, this may be done every 5  years if you have a Pap test in combination with an HPV test. Talk with your health care provider about your test results, treatment options, and if necessary, the need for more tests. Follow these instructions at home: Eating and drinking   Eat a diet that includes fresh fruits and vegetables, whole grains, lean protein, and low-fat  dairy.  Take vitamin and mineral supplements as recommended by your health care provider.  Do not drink alcohol if: ? Your health care provider tells you not to drink. ? You are pregnant, may be pregnant, or are planning to become pregnant.  If you drink alcohol: ? Limit how much you have to 0-1 drink a day. ? Be aware of how much alcohol is in your drink. In the [place] one drink equals one 12 oz bottle of beer (355 mL), one 5 oz glass of wine (148 mL), or one 1 oz glass of hard liquor (44 mL). Lifestyle  Take daily care of your teeth and gums.  Stay active. Exercise for at least 30 minutes on 5 or more days each week.  Do not use any products that contain nicotine or tobacco, such as cigarettes, e-cigarettes, and chewing tobacco. If you need help quitting, ask your health care provider.  If you are sexually active, practice safe sex. Use a condom or other form of birth control (contraception) in order to prevent pregnancy and STIs (sexually transmitted infections). If you plan to become pregnant, see your health care provider for a preconception visit. What's next?  Visit your health care provider once a year for a well check visit.  Ask your health care provider how often you should have your eyes and teeth checked.  Stay up to date on all vaccines. This information is not intended to replace advice given to you by your health care provider. Make sure you discuss any questions you have with your health care provider. Document Revised: 01/20/2018 Document Reviewed: 01/20/2018 Elsevier Patient Education  2020 [employer].

## 2020-01-09 LAB — HEPATITIS C ANTIBODY
Hepatitis C Ab: NONREACTIVE
SIGNAL TO CUT-OFF: 0.01 (ref <1.00)

## 2020-01-09 LAB — COMPREHENSIVE METABOLIC PANEL
AG Ratio: 1.6 (calc) (ref 1.0–2.5)
ALT: 12 U/L (ref 6–29)
AST: 18 U/L (ref 10–30)
Albumin: 4.2 g/dL (ref 3.6–5.1)
Alkaline phosphatase (APISO): 73 U/L (ref 31–125)
BUN: 9 mg/dL (ref 7–25)
CO2: 26 mmol/L (ref 20–32)
Calcium: 9.4 mg/dL (ref 8.6–10.2)
Chloride: 102 mmol/L (ref 98–110)
Creat: 0.59 mg/dL (ref 0.50–1.10)
Globulin: 2.7 g/dL (calc) (ref 1.9–3.7)
Glucose, Bld: 82 mg/dL (ref 65–99)
Potassium: 4.4 mmol/L (ref 3.5–5.3)
Sodium: 137 mmol/L (ref 135–146)
Total Bilirubin: 0.7 mg/dL (ref 0.2–1.2)
Total Protein: 6.9 g/dL (ref 6.1–8.1)

## 2020-01-09 LAB — CBC WITH DIFFERENTIAL/PLATELET
Absolute Monocytes: 448 cells/uL (ref 200–950)
Basophils Absolute: 41 cells/uL (ref 0–200)
Basophils Relative: 0.7 %
Eosinophils Absolute: 30 cells/uL (ref 15–500)
Eosinophils Relative: 0.5 %
HCT: 44.4 % (ref 35.0–45.0)
Hemoglobin: 14.4 g/dL (ref 11.7–15.5)
Lymphs Abs: 2213 cells/uL (ref 850–3900)
MCH: 28.5 pg (ref 27.0–33.0)
MCHC: 32.4 g/dL (ref 32.0–36.0)
MCV: 87.7 fL (ref 80.0–100.0)
MPV: 10.6 fL (ref 7.5–12.5)
Monocytes Relative: 7.6 %
Neutro Abs: 3168 cells/uL (ref 1500–7800)
Neutrophils Relative %: 53.7 %
Platelets: 235 10*3/uL (ref 140–400)
RBC: 5.06 10*6/uL (ref 3.80–5.10)
RDW: 12.5 % (ref 11.0–15.0)
Total Lymphocyte: 37.5 %
WBC: 5.9 10*3/uL (ref 3.8–10.8)

## 2020-01-09 LAB — LIPID PANEL
Cholesterol: 198 mg/dL (ref <200)
HDL: 70 mg/dL (ref >=50)
LDL Cholesterol (Calc): 114 mg/dL (calc) — ABNORMAL HIGH
Non-HDL Cholesterol (Calc): 128 mg/dL (calc) (ref <130)
Total CHOL/HDL Ratio: 2.8 (calc) (ref <5.0)
Triglycerides: 62 mg/dL (ref <150)

## 2020-01-09 LAB — HIV ANTIBODY (ROUTINE TESTING W REFLEX): HIV 1&2 Ab, 4th Generation: NONREACTIVE

## 2020-01-09 LAB — TSH: TSH: 0.43 mIU/L

## 2020-01-13 ENCOUNTER — Encounter: Payer: Self-pay | Admitting: Family Medicine

## 2020-01-15 ENCOUNTER — Ambulatory Visit (INDEPENDENT_AMBULATORY_CARE_PROVIDER_SITE_OTHER): Payer: 59 | Admitting: Psychology

## 2020-01-15 DIAGNOSIS — F4323 Adjustment disorder with mixed anxiety and depressed mood: Secondary | ICD-10-CM | POA: Diagnosis not present

## 2020-01-15 DIAGNOSIS — Z634 Disappearance and death of family member: Secondary | ICD-10-CM | POA: Diagnosis not present

## 2020-02-09 ENCOUNTER — Telehealth: Payer: 59 | Admitting: Family

## 2020-02-09 DIAGNOSIS — J019 Acute sinusitis, unspecified: Secondary | ICD-10-CM

## 2020-02-09 DIAGNOSIS — B9689 Other specified bacterial agents as the cause of diseases classified elsewhere: Secondary | ICD-10-CM | POA: Diagnosis not present

## 2020-02-09 MED ORDER — AMOXICILLIN-POT CLAVULANATE 875-125 MG PO TABS: 1.0000 | ORAL_TABLET | Freq: Two times a day (BID) | ORAL | 0 refills | Status: DC

## 2020-02-09 MED ORDER — FLUCONAZOLE 150 MG PO TABS: 150.0000 mg | ORAL_TABLET | Freq: Once | ORAL | 0 refills | Status: AC

## 2020-02-09 NOTE — Progress Notes (Signed)

## 2020-02-09 NOTE — Addendum Note (Signed)
Addended by: Dutch Quint B on: 02/09/2020 02:47 PM   Modules accepted: Orders

## 2020-02-09 NOTE — Addendum Note (Signed)
Addended by: Waldon Merl on: 02/09/2020 05:29 PM   Modules accepted: Orders

## 2020-02-12 ENCOUNTER — Ambulatory Visit (INDEPENDENT_AMBULATORY_CARE_PROVIDER_SITE_OTHER): Payer: 59 | Admitting: Psychology

## 2020-02-12 DIAGNOSIS — F4323 Adjustment disorder with mixed anxiety and depressed mood: Secondary | ICD-10-CM | POA: Diagnosis not present

## 2020-02-12 DIAGNOSIS — Z634 Disappearance and death of family member: Secondary | ICD-10-CM

## 2020-03-06 ENCOUNTER — Ambulatory Visit: Payer: 59 | Admitting: Psychology

## 2020-03-07 ENCOUNTER — Ambulatory Visit (INDEPENDENT_AMBULATORY_CARE_PROVIDER_SITE_OTHER): Payer: 59 | Admitting: Psychology

## 2020-03-07 DIAGNOSIS — Z634 Disappearance and death of family member: Secondary | ICD-10-CM | POA: Diagnosis not present

## 2020-03-07 DIAGNOSIS — F4323 Adjustment disorder with mixed anxiety and depressed mood: Secondary | ICD-10-CM

## 2020-04-03 ENCOUNTER — Ambulatory Visit (INDEPENDENT_AMBULATORY_CARE_PROVIDER_SITE_OTHER): Payer: 59 | Admitting: Psychology

## 2020-04-03 ENCOUNTER — Ambulatory Visit: Payer: 59 | Admitting: Psychology

## 2020-04-03 DIAGNOSIS — Z634 Disappearance and death of family member: Secondary | ICD-10-CM

## 2020-04-03 DIAGNOSIS — F4323 Adjustment disorder with mixed anxiety and depressed mood: Secondary | ICD-10-CM

## 2020-04-11 ENCOUNTER — Ambulatory Visit (INDEPENDENT_AMBULATORY_CARE_PROVIDER_SITE_OTHER): Payer: 59 | Admitting: Podiatry

## 2020-04-11 ENCOUNTER — Encounter: Payer: Self-pay | Admitting: Podiatry

## 2020-04-11 ENCOUNTER — Other Ambulatory Visit: Payer: Self-pay

## 2020-04-11 DIAGNOSIS — L600 Ingrowing nail: Secondary | ICD-10-CM | POA: Diagnosis not present

## 2020-04-11 NOTE — Progress Notes (Signed)
Subjective:   Patient ID: April Gibbs, female   DOB: 24 y.o.   MRN: [id number]   HPI Patient presents stating that she ran a half marathon and her toenail has turned purple and its been sore and she is worried she is going to lose it.  Patient does not smoke and does like to be very active   Review of Systems  All other systems reviewed and are negative.       Objective:  Physical Exam Vitals and nursing note reviewed.  Constitutional:      Appearance: She is well-developed.  Pulmonary:     Effort: Pulmonary effort is normal.  Musculoskeletal:        General: Normal range of motion.  Skin:    General: Skin is warm.  Neurological:     Mental Status: She is alert.     Neurovascular status intact muscle strength was found to be adequate range of motion adequate.  Patient does have some irritation right hallux nail it is not currently loose with discoloration of the proximal portion.  I did not note any active drainage noted and patient had good digital perfusion     Assessment:  What appears to be a traumatized right hallux nail with no indications of looseness or subungual hematoma     Plan:  H&P reviewed condition.  We discussed drilling the nail but I do not recommend currently as it appears to be stable.  I gave instructions on soaks and bandage fusions along with padding and patient will be seen back as needed

## 2020-04-22 ENCOUNTER — Ambulatory Visit (INDEPENDENT_AMBULATORY_CARE_PROVIDER_SITE_OTHER): Payer: 59 | Admitting: Psychology

## 2020-04-22 DIAGNOSIS — Z634 Disappearance and death of family member: Secondary | ICD-10-CM

## 2020-04-22 DIAGNOSIS — F4323 Adjustment disorder with mixed anxiety and depressed mood: Secondary | ICD-10-CM

## 2020-05-15 ENCOUNTER — Ambulatory Visit (INDEPENDENT_AMBULATORY_CARE_PROVIDER_SITE_OTHER): Payer: 59 | Admitting: Psychology

## 2020-05-15 DIAGNOSIS — F4323 Adjustment disorder with mixed anxiety and depressed mood: Secondary | ICD-10-CM

## 2020-05-15 DIAGNOSIS — Z634 Disappearance and death of family member: Secondary | ICD-10-CM | POA: Diagnosis not present

## 2020-05-25 NOTE — L&D Delivery Note (Signed)
Delivery Note At 8:18 PM a viable female was delivered via Vaginal, Spontaneous (Presentation: Left Occiput Anterior).  APGAR: 9, 9; weight  pending.   Placenta status: Spontaneous;Expressed, Intact.  Cord: 3 vessels with the following complications: None.  Cord pH: na  Anesthesia: Epidural Episiotomy: None Lacerations: 2nd degree;Perineal Suture Repair: 2.0 3.0 vicryl rapide Est. Blood Loss (mL): 200  Mom to postpartum.  Baby to Couplet care / Skin to Skin.  Jobani Sabado J 03/17/2021, 9:02 PM

## 2020-06-03 ENCOUNTER — Ambulatory Visit (INDEPENDENT_AMBULATORY_CARE_PROVIDER_SITE_OTHER): Payer: 59 | Admitting: Psychology

## 2020-06-03 DIAGNOSIS — Z634 Disappearance and death of family member: Secondary | ICD-10-CM | POA: Diagnosis not present

## 2020-06-03 DIAGNOSIS — F4323 Adjustment disorder with mixed anxiety and depressed mood: Secondary | ICD-10-CM

## 2020-06-06 ENCOUNTER — Ambulatory Visit: Payer: 59 | Admitting: Podiatry

## 2020-06-07 ENCOUNTER — Other Ambulatory Visit: Payer: Self-pay

## 2020-06-07 ENCOUNTER — Encounter: Payer: Self-pay | Admitting: Podiatry

## 2020-06-07 ENCOUNTER — Ambulatory Visit (INDEPENDENT_AMBULATORY_CARE_PROVIDER_SITE_OTHER): Payer: 59 | Admitting: Podiatry

## 2020-06-07 DIAGNOSIS — L600 Ingrowing nail: Secondary | ICD-10-CM

## 2020-06-07 NOTE — Patient Instructions (Signed)

## 2020-06-10 NOTE — Progress Notes (Signed)
Subjective:   Patient ID: April Gibbs, female   DOB: 25 y.o.   MRN: [id number]   HPI Patient presents stating her right hallux nail has gotten loose and its been moderately damaged and discolored and it is moderately painful secondary to trauma   ROS      Objective:  Physical Exam  Neurovascular status intact with discoloration of the right hallux nail bed at the current time localized with no proximal edema erythema or drainage noted     Assessment:  Damage to the hallux nailbed secondary to trauma localized with discoloration also noted     Plan:  H&P reviewed condition recommended removal allowing new nail to regrow and explained it may not regrow normally and ultimately may require some form of permanent procedure.  Patient will be seen back to recheck encouraged to call questions

## 2020-07-01 ENCOUNTER — Ambulatory Visit (INDEPENDENT_AMBULATORY_CARE_PROVIDER_SITE_OTHER): Payer: 59 | Admitting: Psychology

## 2020-07-01 DIAGNOSIS — Z634 Disappearance and death of family member: Secondary | ICD-10-CM

## 2020-07-01 DIAGNOSIS — F4323 Adjustment disorder with mixed anxiety and depressed mood: Secondary | ICD-10-CM

## 2020-07-31 ENCOUNTER — Ambulatory Visit (INDEPENDENT_AMBULATORY_CARE_PROVIDER_SITE_OTHER): Payer: 59 | Admitting: Psychology

## 2020-07-31 DIAGNOSIS — F4323 Adjustment disorder with mixed anxiety and depressed mood: Secondary | ICD-10-CM

## 2020-07-31 DIAGNOSIS — Z634 Disappearance and death of family member: Secondary | ICD-10-CM | POA: Diagnosis not present

## 2020-08-26 ENCOUNTER — Ambulatory Visit (INDEPENDENT_AMBULATORY_CARE_PROVIDER_SITE_OTHER): Payer: 59 | Admitting: Psychology

## 2020-08-26 DIAGNOSIS — F4323 Adjustment disorder with mixed anxiety and depressed mood: Secondary | ICD-10-CM

## 2020-08-26 DIAGNOSIS — Z634 Disappearance and death of family member: Secondary | ICD-10-CM

## 2020-09-02 LAB — OB RESULTS CONSOLE RPR: RPR: NONREACTIVE

## 2020-09-02 LAB — OB RESULTS CONSOLE HEPATITIS B SURFACE ANTIGEN: Hepatitis B Surface Ag: NEGATIVE

## 2020-09-02 LAB — OB RESULTS CONSOLE ABO/RH: RH Type: NEGATIVE

## 2020-09-02 LAB — OB RESULTS CONSOLE RUBELLA ANTIBODY, IGM: Rubella: IMMUNE

## 2020-09-02 LAB — OB RESULTS CONSOLE ANTIBODY SCREEN: Antibody Screen: NEGATIVE

## 2020-09-18 LAB — OB RESULTS CONSOLE GC/CHLAMYDIA
Chlamydia: NEGATIVE
Gonorrhea: NEGATIVE

## 2020-10-15 ENCOUNTER — Ambulatory Visit (INDEPENDENT_AMBULATORY_CARE_PROVIDER_SITE_OTHER): Payer: 59 | Admitting: Psychology

## 2020-10-15 DIAGNOSIS — Z634 Disappearance and death of family member: Secondary | ICD-10-CM | POA: Diagnosis not present

## 2020-10-15 DIAGNOSIS — F4323 Adjustment disorder with mixed anxiety and depressed mood: Secondary | ICD-10-CM

## 2020-11-27 ENCOUNTER — Ambulatory Visit (INDEPENDENT_AMBULATORY_CARE_PROVIDER_SITE_OTHER): Payer: 59 | Admitting: Psychology

## 2020-11-27 DIAGNOSIS — Z634 Disappearance and death of family member: Secondary | ICD-10-CM

## 2020-11-27 DIAGNOSIS — F4323 Adjustment disorder with mixed anxiety and depressed mood: Secondary | ICD-10-CM

## 2020-12-31 ENCOUNTER — Ambulatory Visit: Payer: 59 | Admitting: Psychology

## 2021-01-06 LAB — OB RESULTS CONSOLE RPR: RPR: NONREACTIVE

## 2021-01-08 ENCOUNTER — Encounter: Payer: 59 | Admitting: Family Medicine

## 2021-02-05 ENCOUNTER — Ambulatory Visit: Payer: 59 | Admitting: Psychology

## 2021-02-17 LAB — OB RESULTS CONSOLE GBS: GBS: NEGATIVE

## 2021-03-05 ENCOUNTER — Ambulatory Visit: Payer: 59 | Admitting: Psychology

## 2021-03-13 ENCOUNTER — Other Ambulatory Visit: Payer: Self-pay | Admitting: Obstetrics and Gynecology

## 2021-03-13 ENCOUNTER — Encounter (HOSPITAL_COMMUNITY): Payer: Self-pay | Admitting: *Deleted

## 2021-03-13 ENCOUNTER — Telehealth (HOSPITAL_COMMUNITY): Payer: Self-pay | Admitting: *Deleted

## 2021-03-13 LAB — SARS CORONAVIRUS 2 (TAT 6-24 HRS): SARS Coronavirus 2: NEGATIVE

## 2021-03-13 NOTE — Telephone Encounter (Signed)
Preadmission screen  

## 2021-03-14 ENCOUNTER — Other Ambulatory Visit: Payer: Self-pay | Admitting: Obstetrics and Gynecology

## 2021-03-17 ENCOUNTER — Other Ambulatory Visit: Payer: Self-pay

## 2021-03-17 ENCOUNTER — Inpatient Hospital Stay (HOSPITAL_COMMUNITY): Payer: 59

## 2021-03-17 ENCOUNTER — Inpatient Hospital Stay (HOSPITAL_COMMUNITY)
Admission: AD | Admit: 2021-03-17 | Discharge: 2021-03-19 | DRG: 807 | Disposition: A | Discharge status: Home / Self Care | Payer: 59 | Attending: Obstetrics and Gynecology | Admitting: Obstetrics and Gynecology

## 2021-03-17 ENCOUNTER — Encounter (HOSPITAL_COMMUNITY): Payer: Self-pay | Admitting: Obstetrics and Gynecology

## 2021-03-17 ENCOUNTER — Inpatient Hospital Stay (HOSPITAL_COMMUNITY): Payer: 59 | Admitting: Anesthesiology

## 2021-03-17 DIAGNOSIS — Z3A39 39 weeks gestation of pregnancy: Secondary | ICD-10-CM | POA: Diagnosis not present

## 2021-03-17 DIAGNOSIS — O99344 Other mental disorders complicating childbirth: Secondary | ICD-10-CM | POA: Diagnosis present

## 2021-03-17 DIAGNOSIS — Z3490 Encounter for supervision of normal pregnancy, unspecified, unspecified trimester: Secondary | ICD-10-CM | POA: Diagnosis present

## 2021-03-17 DIAGNOSIS — F419 Anxiety disorder, unspecified: Secondary | ICD-10-CM | POA: Diagnosis present

## 2021-03-17 LAB — OB RESULTS CONSOLE HIV ANTIBODY (ROUTINE TESTING): HIV: NONREACTIVE

## 2021-03-17 LAB — CBC
HCT: 38.6 % (ref 36.0–46.0)
Hemoglobin: 13.5 g/dL (ref 12.0–15.0)
MCH: 30.7 pg (ref 26.0–34.0)
MCHC: 35 g/dL (ref 30.0–36.0)
MCV: 87.7 fL (ref 80.0–100.0)
Platelets: 183 10*3/uL (ref 150–400)
RBC: 4.4 MIL/uL (ref 3.87–5.11)
RDW: 12.5 % (ref 11.5–15.5)
WBC: 9.8 10*3/uL (ref 4.0–10.5)
nRBC: 0 % (ref 0.0–0.2)

## 2021-03-17 LAB — RPR: RPR Ser Ql: NONREACTIVE

## 2021-03-17 LAB — TYPE AND SCREEN
ABO/RH(D): O NEG
Antibody Screen: POSITIVE

## 2021-03-17 LAB — OB RESULTS CONSOLE RUBELLA ANTIBODY, IGM: Rubella: IMMUNE

## 2021-03-17 LAB — OB RESULTS CONSOLE HEPATITIS B SURFACE ANTIGEN: Hepatitis B Surface Ag: NEGATIVE

## 2021-03-17 MED ORDER — LACTATED RINGERS IV SOLN
500.0000 mL | Freq: Once | INTRAVENOUS | Status: AC
  Administered 2021-03-17: 500 mL via INTRAVENOUS

## 2021-03-17 MED ORDER — DIBUCAINE (PERIANAL) 1 % EX OINT: 1.0000 "application " | TOPICAL_OINTMENT | CUTANEOUS | Status: DC | PRN

## 2021-03-17 MED ORDER — FENTANYL-BUPIVACAINE-NACL 0.5-0.125-0.9 MG/250ML-% EP SOLN
12.0000 mL/h | EPIDURAL | Status: DC | PRN
  Administered 2021-03-17: 12 mL/h via EPIDURAL
  Filled 2021-03-17: qty 250

## 2021-03-17 MED ORDER — OXYTOCIN-SODIUM CHLORIDE 30-0.9 UT/500ML-% IV SOLN
2.5000 [IU]/h | INTRAVENOUS | Status: DC
  Administered 2021-03-17: 2.5 [IU]/h via INTRAVENOUS
  Filled 2021-03-17: qty 500

## 2021-03-17 MED ORDER — SIMETHICONE 80 MG PO CHEW: 80.0000 mg | CHEWABLE_TABLET | ORAL | Status: DC | PRN

## 2021-03-17 MED ORDER — ONDANSETRON HCL 4 MG/2ML IJ SOLN
4.0000 mg | Freq: Four times a day (QID) | INTRAMUSCULAR | Status: DC | PRN
  Administered 2021-03-17: 4 mg via INTRAVENOUS
  Filled 2021-03-17: qty 2

## 2021-03-17 MED ORDER — TERBUTALINE SULFATE 1 MG/ML IJ SOLN: 0.2500 mg | Freq: Once | INTRAMUSCULAR | Status: DC | PRN

## 2021-03-17 MED ORDER — LACTATED RINGERS IV SOLN: 500.0000 mL | INTRAVENOUS | Status: DC | PRN

## 2021-03-17 MED ORDER — ONDANSETRON HCL 4 MG/2ML IJ SOLN: 4.0000 mg | INTRAMUSCULAR | Status: DC | PRN

## 2021-03-17 MED ORDER — WITCH HAZEL-GLYCERIN EX PADS: 1.0000 "application " | MEDICATED_PAD | CUTANEOUS | Status: DC | PRN

## 2021-03-17 MED ORDER — OXYCODONE-ACETAMINOPHEN 5-325 MG PO TABS
1.0000 | ORAL_TABLET | ORAL | Status: DC | PRN
  Administered 2021-03-18 – 2021-03-19 (×3): 1 via ORAL
  Filled 2021-03-17 (×3): qty 1

## 2021-03-17 MED ORDER — PHENYLEPHRINE 40 MCG/ML (10ML) SYRINGE FOR IV PUSH (FOR BLOOD PRESSURE SUPPORT): 80.0000 ug | PREFILLED_SYRINGE | INTRAVENOUS | Status: DC | PRN

## 2021-03-17 MED ORDER — DIPHENHYDRAMINE HCL 25 MG PO CAPS: 25.0000 mg | ORAL_CAPSULE | Freq: Four times a day (QID) | ORAL | Status: DC | PRN

## 2021-03-17 MED ORDER — OXYCODONE-ACETAMINOPHEN 5-325 MG PO TABS
2.0000 | ORAL_TABLET | ORAL | Status: DC | PRN
Start: 2021-03-17 — End: 2021-03-19

## 2021-03-17 MED ORDER — METHYLERGONOVINE MALEATE 0.2 MG PO TABS
0.2000 mg | ORAL_TABLET | ORAL | Status: DC | PRN
Start: 2021-03-17 — End: 2021-03-19

## 2021-03-17 MED ORDER — SENNOSIDES-DOCUSATE SODIUM 8.6-50 MG PO TABS
2.0000 | ORAL_TABLET | Freq: Every day | ORAL | Status: DC
  Administered 2021-03-18 – 2021-03-19 (×2): 2 via ORAL
  Filled 2021-03-17 (×2): qty 2

## 2021-03-17 MED ORDER — OXYTOCIN BOLUS FROM INFUSION
333.0000 mL | Freq: Once | INTRAVENOUS | Status: AC
  Administered 2021-03-17: 333 mL via INTRAVENOUS

## 2021-03-17 MED ORDER — EPHEDRINE 5 MG/ML INJ: 10.0000 mg | INTRAVENOUS | Status: DC | PRN

## 2021-03-17 MED ORDER — ACETAMINOPHEN 325 MG PO TABS
650.0000 mg | ORAL_TABLET | ORAL | Status: DC | PRN
  Administered 2021-03-17: 650 mg via ORAL
  Filled 2021-03-17: qty 2

## 2021-03-17 MED ORDER — LIDOCAINE HCL (PF) 1 % IJ SOLN
INTRAMUSCULAR | Status: DC | PRN
  Administered 2021-03-17: 10 mL via EPIDURAL

## 2021-03-17 MED ORDER — ACETAMINOPHEN 325 MG PO TABS
650.0000 mg | ORAL_TABLET | ORAL | Status: DC | PRN
  Administered 2021-03-18 – 2021-03-19 (×3): 650 mg via ORAL
  Filled 2021-03-17 (×3): qty 2

## 2021-03-17 MED ORDER — IBUPROFEN 600 MG PO TABS
600.0000 mg | ORAL_TABLET | Freq: Four times a day (QID) | ORAL | Status: DC
  Administered 2021-03-17 – 2021-03-19 (×7): 600 mg via ORAL
  Filled 2021-03-17 (×6): qty 1

## 2021-03-17 MED ORDER — TETANUS-DIPHTH-ACELL PERTUSSIS 5-2.5-18.5 LF-MCG/0.5 IM SUSY: 0.5000 mL | PREFILLED_SYRINGE | Freq: Once | INTRAMUSCULAR | Status: DC

## 2021-03-17 MED ORDER — PRENATAL MULTIVITAMIN CH
1.0000 | ORAL_TABLET | Freq: Every day | ORAL | Status: DC
  Administered 2021-03-18 – 2021-03-19 (×2): 1 via ORAL
  Filled 2021-03-17 (×2): qty 1

## 2021-03-17 MED ORDER — ZOLPIDEM TARTRATE 5 MG PO TABS: 5.0000 mg | ORAL_TABLET | Freq: Every evening | ORAL | Status: DC | PRN

## 2021-03-17 MED ORDER — PHENYLEPHRINE 40 MCG/ML (10ML) SYRINGE FOR IV PUSH (FOR BLOOD PRESSURE SUPPORT)
80.0000 ug | PREFILLED_SYRINGE | INTRAVENOUS | Status: DC | PRN
  Filled 2021-03-17: qty 10

## 2021-03-17 MED ORDER — BENZOCAINE-MENTHOL 20-0.5 % EX AERO
1.0000 "application " | INHALATION_SPRAY | CUTANEOUS | Status: DC | PRN
  Administered 2021-03-19: 1 via TOPICAL
  Filled 2021-03-17 (×2): qty 56

## 2021-03-17 MED ORDER — SOD CITRATE-CITRIC ACID 500-334 MG/5ML PO SOLN: 30.0000 mL | ORAL | Status: DC | PRN

## 2021-03-17 MED ORDER — LACTATED RINGERS IV SOLN: INTRAVENOUS | Status: DC

## 2021-03-17 MED ORDER — DIPHENHYDRAMINE HCL 50 MG/ML IJ SOLN: 12.5000 mg | INTRAMUSCULAR | Status: DC | PRN

## 2021-03-17 MED ORDER — METHYLERGONOVINE MALEATE 0.2 MG/ML IJ SOLN
0.2000 mg | INTRAMUSCULAR | Status: DC | PRN
Start: 2021-03-17 — End: 2021-03-19

## 2021-03-17 MED ORDER — OXYTOCIN-SODIUM CHLORIDE 30-0.9 UT/500ML-% IV SOLN
1.0000 m[IU]/min | INTRAVENOUS | Status: DC
  Administered 2021-03-17: 2 m[IU]/min via INTRAVENOUS

## 2021-03-17 MED ORDER — MISOPROSTOL 25 MCG QUARTER TABLET
25.0000 ug | ORAL_TABLET | ORAL | Status: DC | PRN
  Administered 2021-03-17 (×2): 25 ug via VAGINAL
  Filled 2021-03-17 (×2): qty 1

## 2021-03-17 MED ORDER — ONDANSETRON HCL 4 MG PO TABS: 4.0000 mg | ORAL_TABLET | ORAL | Status: DC | PRN

## 2021-03-17 MED ORDER — LIDOCAINE HCL (PF) 1 % IJ SOLN: 30.0000 mL | INTRAMUSCULAR | Status: DC | PRN

## 2021-03-17 MED ORDER — COCONUT OIL OIL: 1.0000 "application " | TOPICAL_OIL | Status: DC | PRN

## 2021-03-17 NOTE — Anesthesia Preprocedure Evaluation (Signed)
Anesthesia Evaluation  Patient identified by MRN, date of birth, ID band Patient awake    Reviewed: Allergy & Precautions, H&P , NPO status , Patient's Chart, lab work & pertinent test results  Airway Mallampati: I       Dental no notable dental hx.    Pulmonary neg pulmonary ROS,    Pulmonary exam normal breath sounds clear to auscultation       Cardiovascular negative cardio ROS Normal cardiovascular exam Rhythm:regular Rate:Normal     Neuro/Psych negative psych ROS   GI/Hepatic negative GI ROS, Neg liver ROS,   Endo/Other  negative endocrine ROS  Renal/GU negative Renal ROS  negative genitourinary   Musculoskeletal negative musculoskeletal ROS (+)   Abdominal Normal abdominal exam  (+)   Peds  Hematology  (+) Blood dyscrasia, anemia ,   Anesthesia Other Findings   Reproductive/Obstetrics (+) Pregnancy                             Anesthesia Physical Anesthesia Plan  ASA: 2  Anesthesia Plan: Epidural   Post-op Pain Management:    Induction:   PONV Risk Score and Plan:   Airway Management Planned:   Additional Equipment:   Intra-op Plan:   Post-operative Plan:   Informed Consent: I have reviewed the patients History and Physical, chart, labs and discussed the procedure including the risks, benefits and alternatives for the proposed anesthesia with the patient or authorized representative who has indicated his/her understanding and acceptance.       Plan Discussed with:   Anesthesia Plan Comments:         Anesthesia Quick Evaluation

## 2021-03-17 NOTE — Progress Notes (Signed)
April Gibbs is a 25 y.o. G1P0 at [redacted]w[redacted]d by LMP admitted for induction of labor due to above noted indications.  Subjective: comfortable  Objective: BP 99/64   Pulse 88   Temp 98 F (36.7 C) (Axillary)   Resp 18   Ht 5\' 8"  (1.727 m)   Wt 83.7 kg   SpO2 100%   BMI 28.07 kg/m  No intake/output data recorded. Total I/O In: -  Out: 015 [Urine:870]  FHT:  FHR: 135 bpm, variability: moderate,  accelerations:  Present,  decelerations:  Absent UC:   irregular, every 5 minutes SVE:   Dilation: 4.5 Effacement (%): 80 Station: -1 Exam by:: S Grindstaff RN  Labs: Lab Results  Component Value Date   WBC 9.8 03/17/2021   HGB 13.5 03/17/2021   HCT 38.6 03/17/2021   MCV 87.7 03/17/2021   PLT 183 03/17/2021    Assessment / Plan: Induction of labor due to [facility] medical conditions,  progressing well on pitocin  Labor: Progressing normally Preeclampsia:  no signs or symptoms of toxicity Fetal Wellbeing:  Category I Pain Control:  Epidural I/D:  n/a Anticipated MOD:  NSVD  April Gibbs J 03/17/2021, 5:10 PM

## 2021-03-17 NOTE — Anesthesia Procedure Notes (Signed)
Epidural Patient location during procedure: OB Start time: 03/17/2021 12:00 PM End time: 03/17/2021 12:02 PM  Staffing Anesthesiologist: Lyn Hollingshead, MD Performed: anesthesiologist   Preanesthetic Checklist Completed: patient identified, IV checked, site marked, risks and benefits discussed, surgical consent, monitors and equipment checked, pre-op evaluation and timeout performed  Epidural Patient position: sitting Prep: DuraPrep and site prepped and draped Patient monitoring: continuous pulse ox and blood pressure Approach: midline Location: L3-L4 Injection technique: LOR air  Needle:  Needle type: Tuohy  Needle gauge: 17 G Needle length: 9 cm and 9 Needle insertion depth: 4 cm Catheter type: closed end flexible Catheter size: 19 Gauge Catheter at skin depth: 9 cm Test dose: negative and Other  Assessment Events: blood not aspirated, injection not painful, no injection resistance, no paresthesia and negative IV test  Additional Notes Reason for block:procedure for pain

## 2021-03-17 NOTE — Progress Notes (Signed)
April Gibbs is a 25 y.o. G1P0 at [redacted]w[redacted]d by LMP admitted for induction of labor due to above noted indications.  Subjective: comfortable  Objective: BP 103/72   Pulse 71   Temp 98.3 F (36.8 C) (Oral)   Resp 17   Ht 5\' 8"  (1.727 m)   Wt 83.7 kg   SpO2 100%   BMI 28.07 kg/m  No intake/output data recorded. Total I/O In: -  Out: 600 [Urine:600]  FHT:  FHR: 135 bpm, variability: moderate,  accelerations:  Present,  decelerations:  Absent UC:   irregular, every 5 minutes SVE:   Dilation: 3 Effacement (%): 70 Station: -2 Exam by::  (S GrindstaffRN)  Labs: Lab Results  Component Value Date   WBC 9.8 03/17/2021   HGB 13.5 03/17/2021   HCT 38.6 03/17/2021   MCV 87.7 03/17/2021   PLT 183 03/17/2021    Assessment / Plan: Induction of labor due to [facility] medical conditions,  progressing well on pitocin  Labor: Progressing normally Preeclampsia:  no signs or symptoms of toxicity Fetal Wellbeing:  Category I Pain Control:  Epidural I/D:  n/a Anticipated MOD:  NSVD  Tyan Lasure J 03/17/2021, 2:04 PM

## 2021-03-17 NOTE — Plan of Care (Signed)

## 2021-03-17 NOTE — H&P (Signed)
April Gibbs is a 25 y.o. female presenting for IOL for history of vanishing twin and inc maternal anxiety. OB History     Gravida  1   Para      Term      Preterm      AB      Living         SAB      IAB      Ectopic      Multiple      Live Births             Past Medical History:  Diagnosis Date   Anemia    Chicken pox    Frequent headaches    Migraines    Past Surgical History:  Procedure Laterality Date   WISDOM TOOTH EXTRACTION  2013   Family History: family history includes Alcohol abuse in her father; Arthritis in her paternal grandmother; Asthma in her maternal grandmother and paternal grandmother; Birth defects in her maternal grandfather and maternal grandmother; Colon cancer in her paternal grandfather; Depression in her father; Diabetes in her father, maternal grandfather, and paternal grandmother; Early death in her paternal grandfather; Esophageal cancer in her maternal grandmother; Heart disease in her father; Hyperlipidemia in her maternal grandfather and paternal grandmother; Hypertension in her father, maternal grandfather, paternal grandfather, and paternal grandmother; Learning disabilities in her brother; Liver disease in her father; Miscarriages / Stillbirths in her mother and paternal grandmother; Ovarian cancer in her paternal aunt. Social History:  reports that she has never smoked. She has never used smokeless tobacco. She reports current alcohol use. She reports that she does not use drugs.     Maternal Diabetes: No Genetic Screening: Normal Maternal Ultrasounds/Referrals: Other: Fetal Ultrasounds or other Referrals:  None Maternal Substance Abuse:  No Significant Maternal Medications:  None Significant Maternal Lab Results:  Group B Strep negative Other Comments:  None  Review of Systems  Constitutional: Negative.   All other systems reviewed and are negative. Maternal Medical History:  Fetal activity: Perceived fetal  activity is normal.   Last perceived fetal movement was within the past hour.   Prenatal complications: no prenatal complications Prenatal Complications - Diabetes: none.  Dilation: Fingertip Effacement (%): 20 Exam by:: Michaell Cowing RN Blood pressure 123/80, pulse 87, temperature 98.7 F (37.1 C), temperature source Oral, resp. rate 16, height 5\' 8"  (1.727 m), weight 83.7 kg, SpO2 99 %. Maternal Exam:  Uterine Assessment: Contraction strength is mild.  Contraction frequency is irregular.  Abdomen: Patient reports no abdominal tenderness. Fetal presentation: vertex Introitus: Normal vulva. Normal vagina.  Ferning test: not done.  Nitrazine test: not done. Amniotic fluid character: not assessed. Pelvis: questionable for delivery.   Cervix: Cervix evaluated by digital exam.    Physical Exam Vitals and nursing note reviewed.  Constitutional:      Appearance: Normal appearance. She is normal weight.  HENT:     Head: Normocephalic and atraumatic.  Cardiovascular:     Pulses: Normal pulses.     Heart sounds: Normal heart sounds.  Pulmonary:     Effort: Pulmonary effort is normal.     Breath sounds: Normal breath sounds.  Abdominal:     General: Abdomen is flat. Bowel sounds are normal.     Palpations: Abdomen is soft.  Genitourinary:    General: Normal vulva.  Musculoskeletal:        General: Normal range of motion.     Cervical back: Normal range of motion and neck  supple.  Skin:    General: Skin is warm.  Neurological:     General: No focal deficit present.     Mental Status: She is alert and oriented to person, place, and time.  Psychiatric:        Mood and Affect: Mood normal.        Behavior: Behavior normal.    Prenatal labs: ABO, Rh: --/--/O NEG (10/24 0049) Antibody: POS (10/24 0049) Rubella:  imm RPR: Nonreactive (08/15 0000)  HBsAg:   neg HIV:   neg GBS: Negative/-- (09/26 0000)   Assessment/Plan: 39 wk IUP History of vanishing twin Maternal  anxiety IOL   Jeaneane Adamec J 03/17/2021, 6:38 AM

## 2021-03-17 NOTE — Lactation Note (Signed)
This note was copied from a baby's chart. Lactation Consultation Note  Patient Name: April Gibbs BTDHR'[id number] Date: 03/17/2021 Reason for consult: Initial assessment;Mother's request;Difficult latch;Primapara;1st time breastfeeding;Term;Nipple pain/trauma;Breastfeeding assistance Age:27 hours LC attempted different positions to get a deeper latch. Infant latching shallow causing nipple trauma. LC gave 20 NS and mom states better. Mom to use EBM for nipple care and coconut oil.  Mom has comfort gels to use for nipple pain. Mom aware to not use with coconut oil.  Mom using manual pump to pre pump before latching.   Plan 1. Feed based on cues 8-12x 24 hr period. Mom to offer breasts and if needed use 20 NS. 2. If unable to latch, Mom to hand express and offer eBM via spoon ( 5-7 ml) 3. I and O sheet reviewed.   Maternal Data Has patient been taught Hand Expression?: Yes Does the patient have breastfeeding experience prior to this delivery?: No  Feeding Mother's Current Feeding Choice: Breast Milk  LATCH Score Latch: Repeated attempts needed to sustain latch, nipple held in mouth throughout feeding, stimulation needed to elicit sucking reflex.  Audible Swallowing: A few with stimulation  Type of Nipple: Flat  Comfort (Breast/Nipple): Filling, red/small blisters or bruises, mild/mod discomfort  Hold (Positioning): Assistance needed to correctly position infant at breast and maintain latch.  LATCH Score: 5   Lactation Tools Discussed/Used Tools: Flanges;Pump;Comfort gels;Coconut oil;Nipple Shields Nipple shield size: 20 (Mom nipples are short shafted, LC tried biologic nursing position infant still on edge causing trauma. LC used 20 NS able to tolerate and get more depth for a feeding.) Flange Size: 21 Breast pump type: Manual Pump Education: Setup, frequency, and cleaning;Milk Storage Reason for Pumping: elongate nipples Pumping frequency: pre pump 5-10 min before  lattching.  Interventions Interventions: Breast feeding basics reviewed;Support pillows;Education;Assisted with latch;Position options;Skin to skin;Expressed milk;Breast massage;Hand express;LC Services brochure;Infant Driven Feeding Algorithm education;Pre-pump if needed;Breast compression;Adjust position;Hand pump;Coconut oil  Discharge Pump: Personal  Consult Status Consult Status: Follow-up Date: 03/18/21 Follow-up type: In-patient    April Gibbs  April Gibbs 03/17/2021, 11:45 PM

## 2021-03-17 NOTE — Lactation Note (Signed)
This note was copied from a baby's chart. Lactation Consultation Note  Patient Name: Girl Deziree Mokry JOACZ'Y Date: 03/17/2021 Reason for consult: L&D Initial assessment;Mother's request;Difficult latch;Primapara;1st time breastfeeding;Term;Other (Comment);Breastfeeding assistance (Anemia) Age:27 hours  LC assisted with latching, but infant only licking at colostrum off of the nipple. Parents to keep infant STS in cross cradle position to latch her when ready.  Mom to receive further LC support on the floor.  All questions answered at the end of the visit.   Maternal Data Has patient been taught Hand Expression?: Yes Does the patient have breastfeeding experience prior to this delivery?: No  Feeding Mother's Current Feeding Choice: Breast Milk  LATCH Score                    Lactation Tools Discussed/Used    Interventions Interventions: Breast feeding basics reviewed;Support pillows;Education;Assisted with latch;Skin to skin;Expressed milk;Hand express;Breast compression;Adjust position  Discharge    Consult Status Consult Status: Follow-up from L&D Date: 03/18/21 Follow-up type: In-patient    Hyrum Shaneyfelt  [name] 03/17/2021, 8:54 PM

## 2021-03-17 NOTE — Plan of Care (Signed)

## 2021-03-18 DIAGNOSIS — F419 Anxiety disorder, unspecified: Secondary | ICD-10-CM | POA: Diagnosis present

## 2021-03-18 LAB — CBC
HCT: 35.5 % — ABNORMAL LOW (ref 36.0–46.0)
Hemoglobin: 12.5 g/dL (ref 12.0–15.0)
MCH: 30.9 pg (ref 26.0–34.0)
MCHC: 35.2 g/dL (ref 30.0–36.0)
MCV: 87.9 fL (ref 80.0–100.0)
Platelets: 158 10*3/uL (ref 150–400)
RBC: 4.04 MIL/uL (ref 3.87–5.11)
RDW: 12.8 % (ref 11.5–15.5)
WBC: 16.2 10*3/uL — ABNORMAL HIGH (ref 4.0–10.5)
nRBC: 0 % (ref 0.0–0.2)

## 2021-03-18 MED ORDER — RHO D IMMUNE GLOBULIN 1500 UNIT/2ML IJ SOSY
300.0000 ug | PREFILLED_SYRINGE | Freq: Once | INTRAMUSCULAR | Status: AC
  Administered 2021-03-18: 300 ug via INTRAVENOUS
  Filled 2021-03-18: qty 2

## 2021-03-18 NOTE — Progress Notes (Signed)
PPD # 1 S/P NSVD  Live born female  Birth Weight: 7 lb 2.6 oz (3249 g) APGAR: 9, 9  Newborn Delivery   Birth date/time: 03/17/2021 20:18:00 Delivery type: Vaginal, Spontaneous     Baby name: April Gibbs  Delivering provider: Brien Few   Episiotomy:None   Lacerations:2nd degree;Perineal   Feeding: breast  Pain control at delivery: Epidural   S:  Reports feeling well today. Reports some perineal discomfort. Breastfeeding with nipple shields. Lactation support. Husband at the bedside.              Tolerating PO/No nausea or vomiting             Bleeding is light             Pain controlled with acetaminophen and ibuprofen (OTC)             Up ad lib/ambulatory/voiding without difficulties   O:  A & O x 3, in no apparent distress  Vitals:   03/17/21 2220 03/17/21 2330 03/18/21 0400 03/18/21 0805  BP: 115/80 113/79 115/75 117/76  Pulse: (!) 105 (!) 107 63 76  Resp: 18 17 17    Temp: 98.1 F (36.7 C) 98.3 F (36.8 C) 97.7 F (36.5 C) 98.4 F (36.9 C)  TempSrc: Axillary Oral Oral Oral  SpO2: 98% 96% 99% 98%  Weight:      Height:       Recent Labs    03/17/21 0049 03/18/21 0408  WBC 9.8 16.2*  HGB 13.5 12.5  HCT 38.6 35.5*  PLT 183 158    Blood type: --/--/O NEG (10/24 0049)  Rubella: Immune (10/24 0000)   I&O: I/O last 3 completed shifts: In: -  Out: 1270 [Urine:1070; Blood:200]          No intake/output data recorded.   Gen: AAO x 3, NAD  Abdomen: soft, non-tender, non-distended             Fundus: firm, non-tender, U-1  Perineum: repair intact  Lochia: small  Extremities: no edema, no calf pain or tenderness   A/P:  PPD # 1 25 y.o., G1P1001  Principal Problem:   Postpartum care following vaginal delivery 10/24  Doing well - stable status  Routine post partum orders Active Problems:   Encounter for induction of labor   SVD 10/24   Anxiety  Denies diagnosis of anxiety or any current symptoms  Social work note in chart  Close PP F/U for mood    Second degree perineal laceration  Discussed perineal care and comfort measures.   Anticipate discharge tomorrow.   [name], MSN, CNM 03/18/2021, 10:42 AM

## 2021-03-18 NOTE — Anesthesia Postprocedure Evaluation (Signed)
Anesthesia Post Note  Patient: April Gibbs  Procedure(s) Performed: AN AD HOC LABOR EPIDURAL     Patient location during evaluation: Mother Baby Anesthesia Type: Epidural Level of consciousness: awake and alert Pain management: pain level controlled Vital Signs Assessment: post-procedure vital signs reviewed and stable Respiratory status: spontaneous breathing, nonlabored ventilation and respiratory function stable Cardiovascular status: stable Postop Assessment: no headache, no backache and epidural receding Anesthetic complications: no   No notable events documented.  Last Vitals:  Vitals:   03/17/21 2146 03/17/21 2220  BP: 118/68 115/80  Pulse: (!) 122 (!) 105  Resp:  18  Temp:  36.7 C  SpO2:  98%    Last Pain:  Vitals:   03/17/21 2330  TempSrc:   PainSc: 0-No pain   Pain Goal:                   Milton Sagona

## 2021-03-18 NOTE — Lactation Note (Signed)
This note was copied from a baby's chart. Lactation Consultation Note  Patient Name: April Gibbs [id number] Date: 03/18/2021 Reason for consult: Follow-up assessment;Primapara;1st time breastfeeding;Term;Infant weight loss;Other (Comment);Nipple pain/trauma;RN request;Mother's request;Breastfeeding assistance (1 % weight loss) Age:27 hours As LC entered the room / baby latched on the right breast with depth and per mom comfortable . Mom mentioned the baby had been feeding already for 30 mins with swallows. Mom had a hand pump for pre - pumping.  Baby released nipple well rounded.  Left nipple sorer than the right reddened/ small intact blister like area noted.   LC assessed resized mom for a Nipple shield ( #20 NS to snug , #24 NS borderline loose). LC offered to try to latch after pre -pumping with the hand pump and firm support. Baby opened wide / latched for a few minutes with swallows and off.  LC Plan :  Sore nipple tx - comfort gels after feeding or pumping / alternating with shells while awake.  Give the left breast a break do to soreness ( stimulate with pumping )  Feed from the right breast and spoon feed EBM with a spoon for now.  Prior to latching - breast massage, hand express, prepump with the hand pump and latch with firm support . Feed for 15 -20 mins / supplement with EBM if available.  Post pump after every feeding  both breast for 15 mins / save milk for next feeding.  LC did discuss with mom if EBM not available and baby is still really hungry consider donor milk with a consent or formula. LC stressed feeding the baby.   Maternal Data Has patient been taught Hand Expression?: Yes (steady flow after latch / LC recommended applying it on the nipples)  Feeding Mother's Current Feeding Choice: Breast Milk  LATCH Score Latch:  (latched with depth)  Audible Swallowing:  (increased swallows noted with warm moist compress on the breast)  Type of Nipple:  (nipple well  rounded when baby released.)  Comfort (Breast/Nipple):  (per mom comfortable)  Hold (Positioning):  (mom had latched the baby)      Lactation Tools Discussed/Used Tools: Shells;Pump;Flanges;Coconut oil;Comfort gels;Nipple [employer];Other (comment) (see LC note for details) Nipple shield size: 20;Other (comment);24 (LC resized the NS - #20 NS tp snug . the #24 NS borderline loose, but will accommodate the nipple / areola complex. mom can latch on the Rt without the NS / left due to soreness - taking a break.) Flange Size: 24;21;Other (comment) (LC recheck the size and increased to #24 F to work on stretching the Nipple/ areola complex / mom comfortable.) Breast pump type: Manual;Double-Electric Breast Pump;Other (comment) (mom is going to take a nap after after the next feeding will add post pumping with the DEBP.) Pump Education: Milk Storage;Setup, frequency, and cleaning Reason for Pumping: to enhance volume of milk to supplement due to sore nipples.  Interventions Interventions: Breast feeding basics reviewed;Assisted with latch;Skin to skin;Breast massage;Hand express;Breast compression;Shells;Hand pump;DEBP;Education  Discharge Pump: Personal;Manual  Consult Status Consult Status: Follow-up Date: 03/18/21 Follow-up type: In-patient    [place] 03/18/2021, 3:04 PM

## 2021-03-18 NOTE — Social Work (Signed)
CSW received consult for hx of Anxiety. CSW met with MOB to offer support and complete assessment.    CSW met with MOB at bedside and introduced CSW role. CSW observed MOB holding the infant. CSW congratulated MOB. MOB presented calm and welcoming of April Gibbs visit. CSW inquired how MOB has felt since giving birth. MOB expressed feeling good with a good L&D experience. CSW inquired about MOB history of anxiety. MOB reported she does not have anxiety, she was not diagnosed and has not received treatment. MOB shared during early in the pregnancy there was twin loss that triggered normal emotions. Otherwise, she had a good pregnancy and copes well. CSW provided education regarding the baby blues period vs. perinatal mood disorders, discussed treatment and gave resources for mental health follow up if concerns arise.  CSW recommended MOB complete aself-evaluation during the postpartum time period using the New Mom Checklist from Postpartum Progress and encouraged MOB to contact a medical professional if symptoms are noted at any time. MOB denied SI/HI. MOB identified her spouse April Gibbs as a support.   CSW provided review of Sudden Infant Death Syndrome (SIDS) precautions. MOB reported she has all essential items for the infant including a bassinet where the infant will sleep. MOB has chosen [facility] for infant's follow up care. CSW assessed MOB for additional needs. MOB reported no further need.   CSW identifies no further need for intervention and no barriers to discharge at this time.  Kathrin Greathouse, MSW, LCSW [facility] [place] Worker  [phone] 03/18/2021  10:22 AM

## 2021-03-18 NOTE — Lactation Note (Signed)
This note was copied from a baby's chart. Lactation Consultation Note  Patient Name: April Gibbs Date: 03/18/2021   Age:27 hours  Mom states feeding going well. She was able to latch infant on both breasts. Mom using coconut oil and breast shells on arrival.  Mom post pumping offering back colostrum after latching. Infant adequate urine and stool output.  Mom states nipples are sore but improving.  Mom to call for latch assistance with next feeding.   Maternal Data    Feeding    LATCH Score Latch: Grasps breast easily, tongue down, lips flanged, rhythmical sucking.  Audible Swallowing: A few with stimulation  Type of Nipple: Everted at rest and after stimulation  Comfort (Breast/Nipple): Filling, red/small blisters or bruises, mild/mod discomfort  Hold (Positioning): Assistance needed to correctly position infant at breast and maintain latch.  LATCH Score: 7   Lactation Tools Discussed/Used    Interventions    Discharge    Consult Status      [name]  [name] 03/18/2021, 7:38 PM

## 2021-03-19 LAB — RH IG WORKUP (INCLUDES ABO/RH)
Fetal Screen: NEGATIVE
Gestational Age(Wks): 39
Unit division: 0

## 2021-03-19 MED ORDER — OXYCODONE-ACETAMINOPHEN 5-325 MG PO TABS: 1.0000 | ORAL_TABLET | ORAL | 0 refills | Status: DC | PRN

## 2021-03-19 MED ORDER — BENZOCAINE-MENTHOL 20-0.5 % EX AERO: 1.0000 "application " | INHALATION_SPRAY | CUTANEOUS | 1 refills | Status: DC | PRN

## 2021-03-19 MED ORDER — IBUPROFEN 600 MG PO TABS: 600.0000 mg | ORAL_TABLET | Freq: Four times a day (QID) | ORAL | 0 refills | Status: AC

## 2021-03-19 MED ORDER — ACETAMINOPHEN 325 MG PO TABS: 650.0000 mg | ORAL_TABLET | ORAL | 1 refills | Status: AC | PRN

## 2021-03-19 NOTE — Discharge Summary (Signed)
OB Discharge Summary  Patient Name: April Gibbs DOB: 23-Jun-1995 MRN: [id number]  Date of admission: 03/17/2021 Delivering provider: Brien Few   Admitting diagnosis: Encounter for induction of labor [Z34.90] Intrauterine pregnancy: [redacted]w[redacted]d     Secondary diagnosis: Patient Active Problem List   Diagnosis Date Noted   SVD 10/24 03/18/2021   Postpartum care following vaginal delivery 10/24 03/18/2021   Anxiety 03/18/2021   Second degree perineal laceration 03/18/2021   Encounter for induction of labor 03/17/2021     Date of discharge: 03/19/2021   Discharge diagnosis: Principal Problem:   Postpartum care following vaginal delivery 10/24 Active Problems:   Encounter for induction of labor   SVD 10/24   Anxiety   Second degree perineal laceration                                                              Post partum procedures: None  Augmentation: AROM, Pitocin, and Cytotec Pain control: Epidural  Laceration:2nd degree;Perineal  Episiotomy:None  Complications: None  Hospital course:  Induction of Labor With Vaginal Delivery   25 y.o. yo G1P1001 at [redacted]w[redacted]d was admitted to the hospital 03/17/2021 for induction of labor.  Indication for induction:  Elective IOL for history of vanishing twin .  Patient had an uncomplicated labor course as follows: Membrane Rupture Time/Date: 8:20 AM ,03/17/2021   Delivery Method:Vaginal, Spontaneous  Episiotomy: None  Lacerations:  2nd degree;Perineal  Details of delivery can be found in separate delivery note.  Patient had a routine postpartum course. Patient plan to be discharged home 03/19/21 with close F/U in 2 weeks for mood.   Newborn Data: Birth date:03/17/2021  Birth time:8:18 PM  Gender:Female  Living status:Living  Apgars:9 ,9  Weight:3249 g   Physical exam  Vitals:   03/18/21 0805 03/18/21 1132 03/18/21 2117 03/19/21 0622  BP: 117/76 113/77 108/70 105/75  Pulse: 76 89 65 78  Resp:  20 19 18   Temp: 98.4 F (36.9  C) 97.9 F (36.6 C) 97.6 F (36.4 C) 97.6 F (36.4 C)  TempSrc: Oral Oral Oral Oral  SpO2: 98% 98% 98% 97%  Weight:      Height:       General: alert, cooperative, and no distress, doing well, denies feeling of anxiety Lochia: small Uterine Fundus: firm, U- 1 Incision: N/A Perineum: repair intact, well approximated, mild edema, no hematoma DVT Evaluation: No evidence of DVT seen on physical exam. No cords or calf tenderness. No significant calf/ankle edema. Labs: Lab Results  Component Value Date   WBC 16.2 (H) 03/18/2021   HGB 12.5 03/18/2021   HCT 35.5 (L) 03/18/2021   MCV 87.9 03/18/2021   PLT 158 03/18/2021   CMP Latest Ref Rng & Units 01/08/2020  Glucose 65 - 99 mg/dL 82  BUN 7 - 25 mg/dL 9  Creatinine 0.50 - 1.10 mg/dL 0.59  Sodium 135 - 146 mmol/L 137  Potassium 3.5 - 5.3 mmol/L 4.4  Chloride 98 - 110 mmol/L 102  CO2 20 - 32 mmol/L 26  Calcium 8.6 - 10.2 mg/dL 9.4  Total Protein 6.1 - 8.1 g/dL 6.9  Total Bilirubin 0.2 - 1.2 mg/dL 0.7  Alkaline Phos 38 - 126 U/L -  AST 10 - 30 U/L 18  ALT 6 - 29 U/L 12   Edinburgh  Postnatal Depression Scale Screening Tool 03/18/2021  I have been able to laugh and see the funny side of things. 0  I have looked forward with enjoyment to things. 0  I have blamed myself unnecessarily when things went wrong. 0  I have been anxious or worried for no good reason. 0  I have felt scared or panicky for no good reason. 0  Things have been getting on top of me. 0  I have been so unhappy that I have had difficulty sleeping. 0  I have felt sad or miserable. 0  I have been so unhappy that I have been crying. 0  The thought of harming myself has occurred to me. 0  Edinburgh Postnatal Depression Scale Total 0    Vaccines: TDaP UTD           Flu    Declined         COVID-19   Declined  Discharge instructions:  per After Visit Summary and [facility] booklet  After Visit Meds:  Allergies as of 03/19/2021   No Known Allergies       Medication List     STOP taking these medications    loratadine 10 MG tablet Commonly known as: CLARITIN   Pazeo 0.7 % Soln Generic drug: Olopatadine HCl   UNABLE TO FIND   UNABLE TO FIND   UNABLE TO FIND   UNABLE TO FIND       TAKE these medications    acetaminophen 325 MG tablet Commonly known as: Tylenol Take 2 tablets (650 mg total) by mouth every 4 (four) hours as needed (for pain scale < 4).   benzocaine-Menthol 20-0.5 % Aero Commonly known as: DERMOPLAST Apply 1 application topically as needed for irritation (perineal discomfort).   ibuprofen 600 MG tablet Commonly known as: ADVIL Take 1 tablet (600 mg total) by mouth every 6 (six) hours.   oxyCODONE-acetaminophen 5-325 MG tablet Commonly known as: PERCOCET/ROXICET Take 1 tablet by mouth every 4 (four) hours as needed (pain scale 4-7).   prenatal multivitamin Tabs tablet Take 1 tablet by mouth in the morning and at bedtime. Mini softgel; pt takes 1 time daily.        Diet: routine diet  Activity: Advance as tolerated. Pelvic rest for 6 weeks.   Newborn Data: Live born female  Birth Weight: 7 lb 2.6 oz (3249 g) APGAR: 26, 9  Newborn Delivery   Birth date/time: 03/17/2021 20:18:00 Delivery type: Vaginal, Spontaneous       Named Casandra Doffing Baby Feeding: Breast Disposition:home with mother  Delivery Report:  Review the Delivery Report for details.    Follow up:   Follow-up Information     Brien Few, MD. Schedule an appointment as soon as possible for a visit in 2 week(s).   Specialty: Obstetrics and Gynecology Contact information: [place] [address] [place], SN[facility] 03/19/2021, 10:04 AM

## 2021-03-19 NOTE — Lactation Note (Addendum)
This note was copied from a baby's chart. Lactation Consultation Note  Patient Name: [name] [id number] Date: 03/19/2021 Reason for consult: Follow-up assessment Age:27 hours Mother reports that her nipples are very sore. She reports that she is a pain scale of #5 when not touching her nipples and a#10 when infant latches on and soon the pain goes away. Observed that mother has cracks and wounds she reports came from using the nipple shield. Observed redness on both nipple.   Mother reports that she has been hand expressing and then she has been using the double pump for longer periods of times  Infant cring and beginning cradle in fathers arms.  Advised lying infant chest to chest with mother and hand expressed colostrum with mother allowing infant to suck drops of colostrum off of mothers finger. Infant quickly soothed down and stopped crying.   Suggested that mother call OB and request RX for [facility] for mothers nipples.  Father made a phone call to [facility] office.  Encouraged mother to hand express in colostrum bottles and to  save for next feeding. mother reports that she will [facility] for next feeding.  Advised mother to follow up with [facility] OP dept for pre and post wt assessment.  Maternal Data    Feeding Mother's Current Feeding Choice: Breast Milk  LATCH Score                    Lactation Tools Discussed/Used    Interventions    Discharge    Consult Status Consult Status: Follow-up Date: 03/19/21 Follow-up type: In-patient    Jess Barters [facility] 03/19/2021, 11:15 AM

## 2021-03-24 ENCOUNTER — Inpatient Hospital Stay (HOSPITAL_COMMUNITY): Admit: 2021-03-24 | Payer: Self-pay

## 2021-03-27 ENCOUNTER — Telehealth (HOSPITAL_COMMUNITY): Payer: Self-pay | Admitting: *Deleted

## 2021-03-27 NOTE — Telephone Encounter (Signed)
Patient voiced no questions or concerns regarding her own health. EPDS = 1. Patient voiced no questions or concerns regarding baby at this time. Patient reported infant sleeps in a bassinet on her back. RN reviewed ABCs of safe sleep - patient verbalized understanding. Patient requested RN email information on hospital's virtual postpartum classes and support groups. Email sent. Erline Levine, RN, 03/27/21, [phone]

## 2022-01-16 IMAGING — US US PELVIS COMPLETE WITH TRANSVAGINAL
1 series · 13 of 25 positions shown · non-contrast
Comparison: None
COMPARISON: None

Addendum:
CLINICAL DATA: LEFT lower quadrant pain for 1 month, irregular
menstrual cycle since stopping birth control pills in April 2019;
LMP 08/03/2019



[Series 1: us pelvis complete with transvaginal · 0.28mm/px · 13 of 46 slices shown]
[im 1/46]
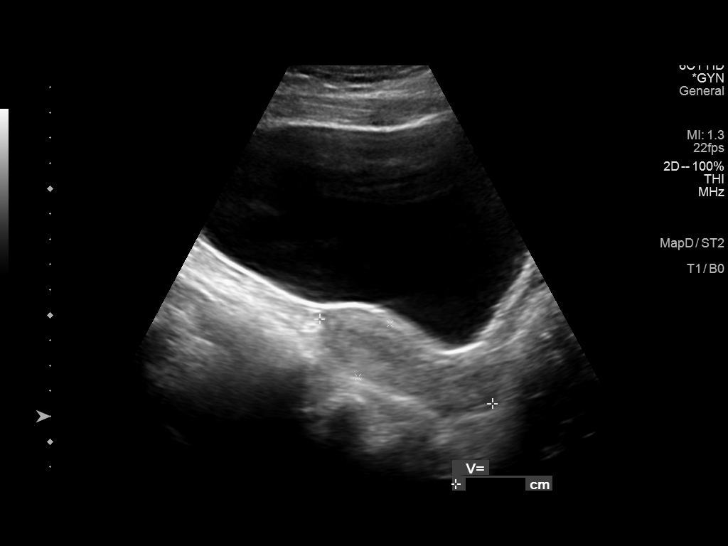
[im 4/46]
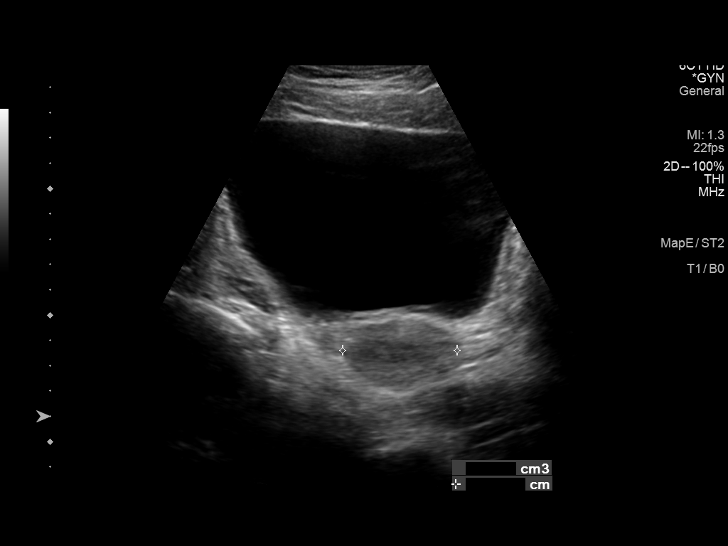
[im 8/46]
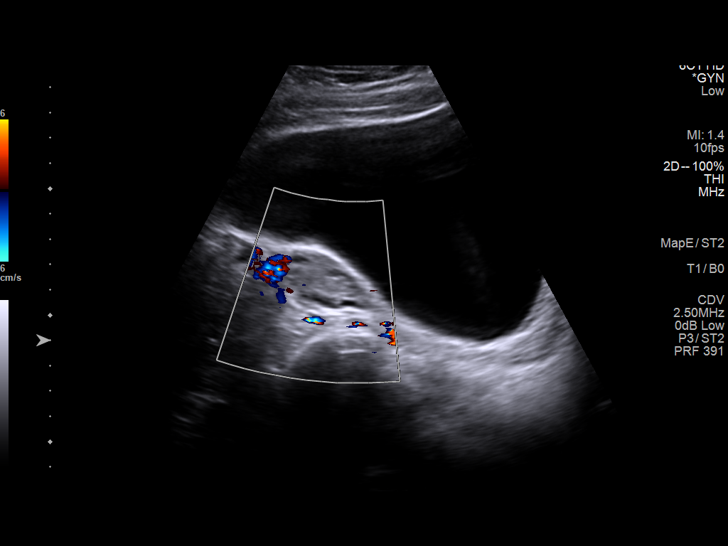
[im 12/46]
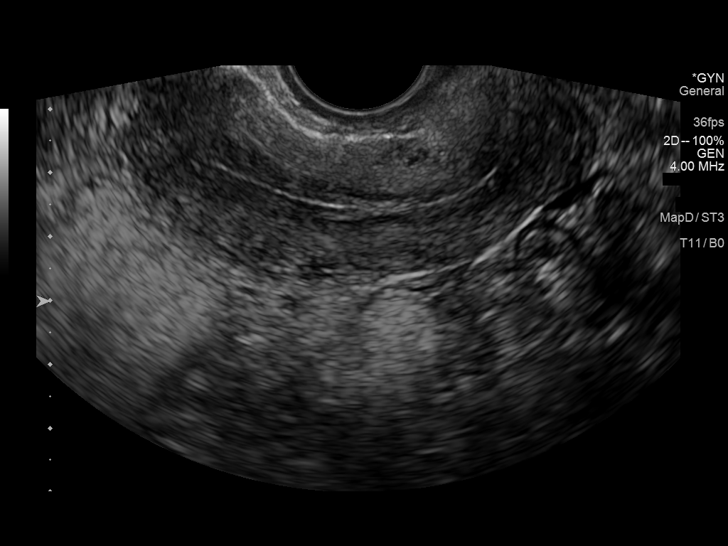
[im 16/46]
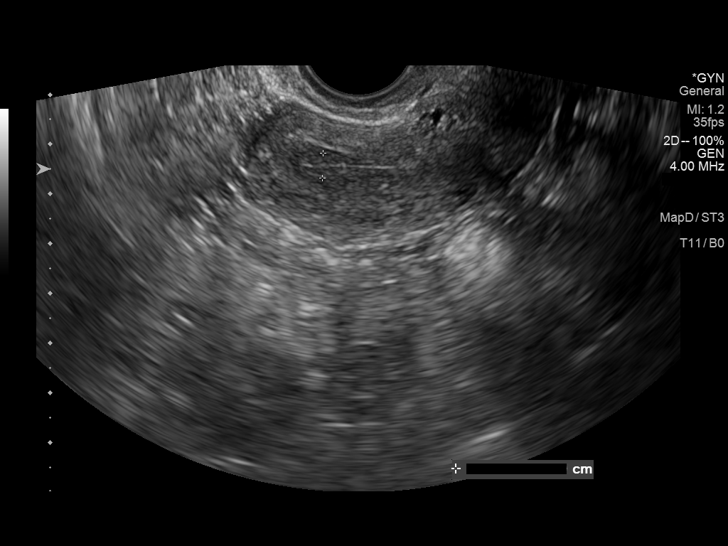
[im 19/46]
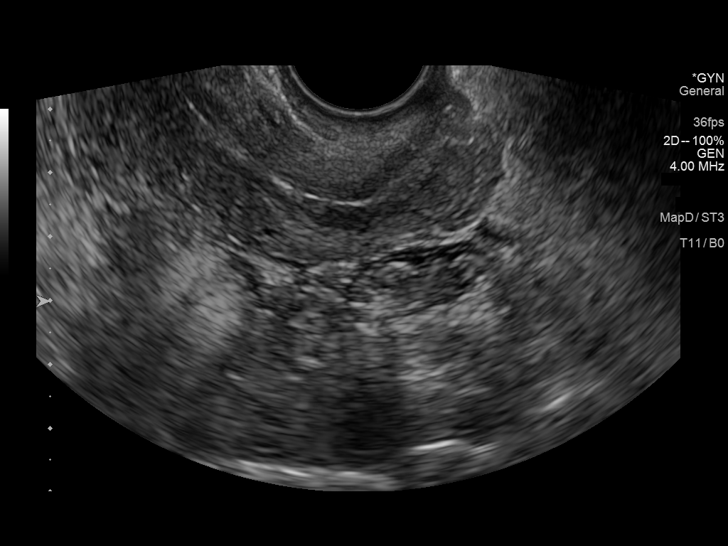
[im 23/46]
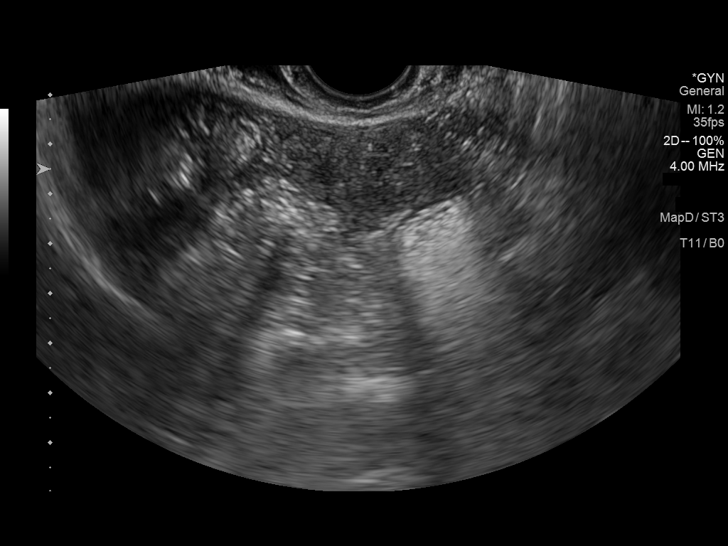
[im 27/46]
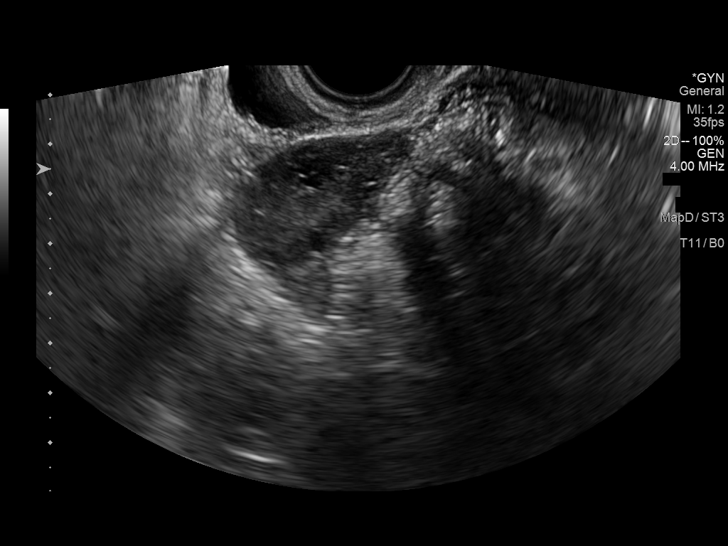
[im 31/46]
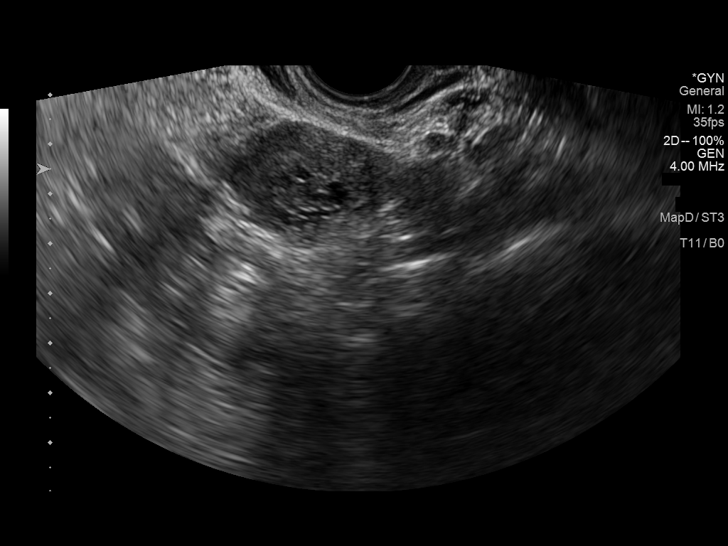
[im 34/46]
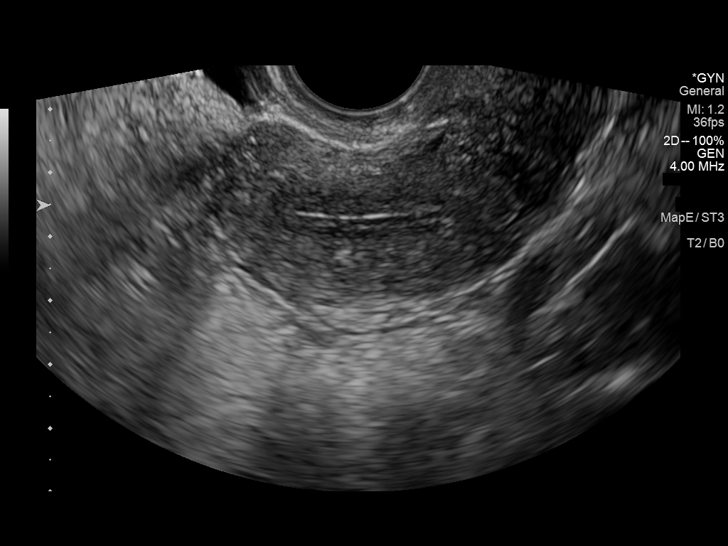
[im 38/46]
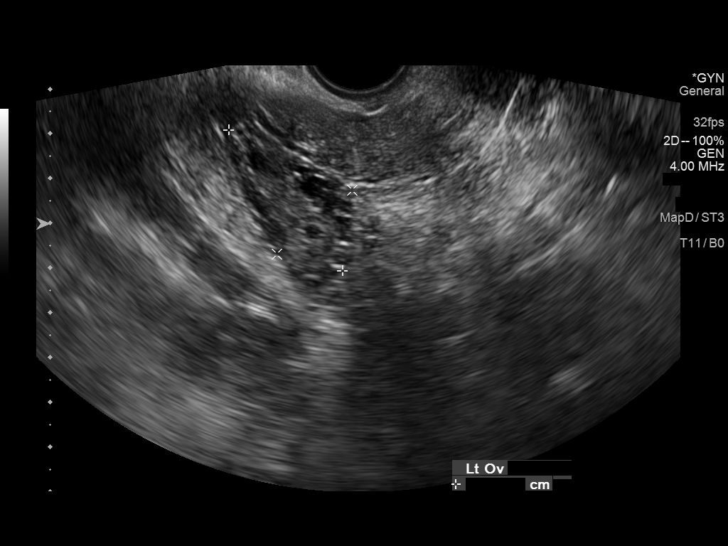
[im 42/46]
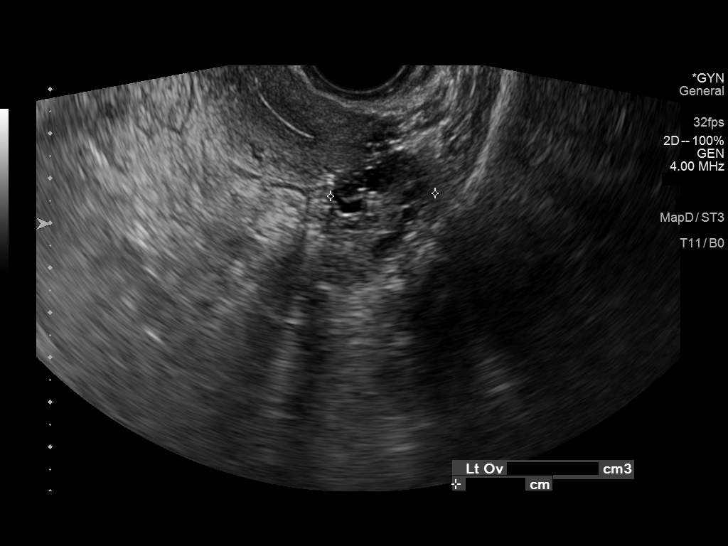
[im 46/46]
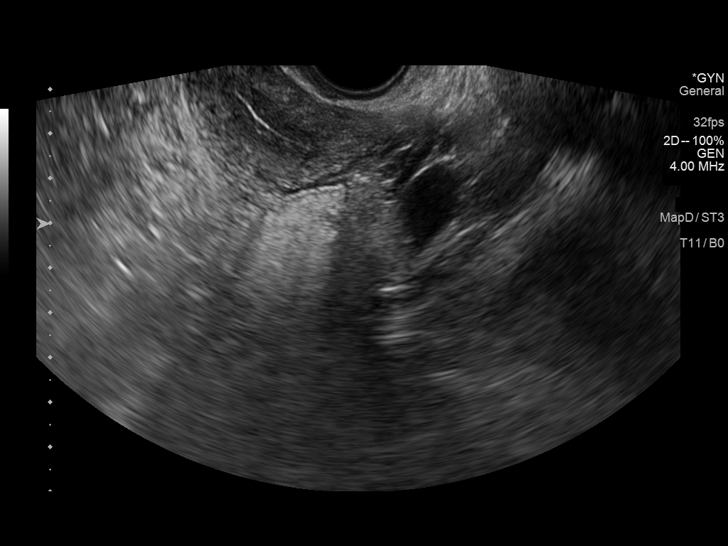

[13 of 25 positions shown; findings below may reference images not displayed]

FINDINGS: Uterus

Measurements: 7.6 x 2.5 x 4.5 cm = volume: 44 mL. Anteverted. Normal
morphology without mass

Endometrium

Thickness: 6 mm.  No endometrial fluid or focal abnormality

Right ovary

Measurements: 4.2 x 2.2 x 3.0 cm = volume: 14.5 mL. Multiple
follicles without mass

Left ovary

Measurements: 4.2 x 2.2 x 2.3 cm = volume: 10.9 mL. Multiple
follicles without mass

Other findings

No free pelvic fluid.  No adnexal masses.
IMPRESSION: Normal exam.

ADDENDUM:
Discussed with Dr. Kwan.

Numerous normal sized follicles rim the periphery of the LEFT ovary,
approximately 10 in number.

This is nonspecific but could be seen with polycystic ovarian
syndrome.

Approximately 6 or 7 follicles are identified in the periphery of
the RIGHT ovary.

*** End of Addendum ***
FINDINGS: Uterus

Measurements: 7.6 x 2.5 x 4.5 cm = volume: 44 mL. Anteverted. Normal
morphology without mass

Endometrium

Thickness: 6 mm.  No endometrial fluid or focal abnormality

Right ovary

Measurements: 4.2 x 2.2 x 3.0 cm = volume: 14.5 mL. Multiple
follicles without mass

Left ovary

Measurements: 4.2 x 2.2 x 2.3 cm = volume: 10.9 mL. Multiple
follicles without mass

Other findings

No free pelvic fluid.  No adnexal masses.
IMPRESSION: Normal exam.

## 2022-05-25 NOTE — L&D Delivery Note (Signed)
Delivery Note At 4:19 PM a viable and healthy female was delivered via Vaginal, Spontaneous (Presentation:   ROA   ).  APGAR: 8, 9; weight  pending.   Placenta status: Spontaneous, Intact.  Cord: 3 vessels with the following complications: None.  Cord pH: na  Anesthesia: Epidural Episiotomy: None Lacerations: 2nd degree Suture Repair: 2.0 vicryl Est. Blood Loss (mL): 155  Mom to postpartum.  Baby to Couplet care / Skin to Skin.  Chaniah Cisse J 02/25/2023, 5:25 PM

## 2022-07-30 LAB — OB RESULTS CONSOLE HEPATITIS B SURFACE ANTIGEN: Hepatitis B Surface Ag: NEGATIVE

## 2022-07-30 LAB — OB RESULTS CONSOLE RUBELLA ANTIBODY, IGM: Rubella: IMMUNE

## 2022-07-30 LAB — OB RESULTS CONSOLE RPR: RPR: NONREACTIVE

## 2022-07-30 LAB — OB RESULTS CONSOLE HIV ANTIBODY (ROUTINE TESTING): HIV: NONREACTIVE

## 2022-07-30 LAB — HEPATITIS C ANTIBODY: HCV Ab: NEGATIVE

## 2022-08-20 LAB — OB RESULTS CONSOLE GC/CHLAMYDIA
Chlamydia: NEGATIVE
Neisseria Gonorrhea: NEGATIVE

## 2022-12-02 LAB — OB RESULTS CONSOLE ANTIBODY SCREEN: Antibody Screen: NEGATIVE

## 2023-01-26 LAB — OB RESULTS CONSOLE GBS: GBS: POSITIVE

## 2023-02-19 ENCOUNTER — Other Ambulatory Visit: Payer: Self-pay | Admitting: Obstetrics and Gynecology

## 2023-02-21 NOTE — H&P (Signed)
April Gibbs is a 27 y.o. female presenting for iOL due to worsening maternal anxiety. OB History     Gravida  1   Para  1   Term  1   Preterm      AB      Living  1      SAB      IAB      Ectopic      Multiple  0   Live Births  1          Past Medical History:  Diagnosis Date   Anemia    Chicken pox    Frequent headaches    Migraines    Past Surgical History:  Procedure Laterality Date   WISDOM TOOTH EXTRACTION  2013   Family History: family history includes Alcohol abuse in her father; Arthritis in her paternal grandmother; Asthma in her maternal grandmother and paternal grandmother; Birth defects in her maternal grandfather and maternal grandmother; Colon cancer in her paternal grandfather; Depression in her father; Diabetes in her father, maternal grandfather, and paternal grandmother; Early death in her paternal grandfather; Esophageal cancer in her maternal grandmother; Heart disease in her father; Hyperlipidemia in her maternal grandfather and paternal grandmother; Hypertension in her father, maternal grandfather, paternal grandfather, and paternal grandmother; Learning disabilities in her brother; Liver disease in her father; Miscarriages / [place] in her mother and paternal grandmother; Ovarian cancer in her paternal aunt. Social History:  reports that she has never smoked. She has never used smokeless tobacco. She reports current alcohol use. She reports that she does not use drugs.     Maternal Diabetes: No Genetic Screening: Normal Maternal Ultrasounds/Referrals: Normal Fetal Ultrasounds or other Referrals:  None Maternal Substance Abuse:  No Significant Maternal Medications:  None Significant Maternal Lab Results:  Group B Strep positive Number of Prenatal Visits:greater than 3 verified prenatal visits Maternal Vaccinations:TDap Other Comments:  None  Review of Systems  Constitutional: Negative.   All other systems reviewed and are  negative.  Maternal Medical History:  Reason for admission: Contractions.   Contractions: Onset was less than 1 hour ago.   Frequency: rare.   Fetal activity: Perceived fetal activity is normal.   Prenatal complications: no prenatal complications Prenatal Complications - Diabetes: none.     unknown if currently breastfeeding. Maternal Exam:  Uterine Assessment: Contraction strength is mild.  Contraction frequency is rare.  Abdomen: Patient reports no abdominal tenderness. Fetal presentation: vertex Introitus: Normal vulva. Normal vagina.  Ferning test: not done.  Nitrazine test: not done. Pelvis: adequate for delivery.   Cervix: Cervix evaluated by digital exam.     Physical Exam Constitutional:      Appearance: Normal appearance. She is normal weight.  HENT:     Head: Normocephalic and atraumatic.  Cardiovascular:     Rate and Rhythm: Normal rate and regular rhythm.     Pulses: Normal pulses.     Heart sounds: Normal heart sounds.  Pulmonary:     Effort: Pulmonary effort is normal.     Breath sounds: Normal breath sounds.  Abdominal:     General: Bowel sounds are normal.     Palpations: Abdomen is soft.  Genitourinary:    General: Normal vulva.  Musculoskeletal:        General: Normal range of motion.     Cervical back: Normal range of motion and neck supple.  Skin:    General: Skin is warm.  Neurological:     General: No focal  deficit present.     Mental Status: She is alert.  Psychiatric:        Mood and Affect: Mood normal.     Prenatal labs: ABO, Rh:   Antibody:   Rubella:   RPR:    HBsAg:    HIV:    GBS:     Assessment/Plan: 39+ wk iup Situational anxiety  GBS pos IV abx IOL   April Gibbs 02/21/2023, 11:56 AM

## 2023-02-23 ENCOUNTER — Telehealth (HOSPITAL_COMMUNITY): Payer: Self-pay | Admitting: *Deleted

## 2023-02-23 ENCOUNTER — Encounter (HOSPITAL_COMMUNITY): Payer: Self-pay | Admitting: *Deleted

## 2023-02-23 NOTE — Telephone Encounter (Signed)
Preadmission screen  

## 2023-02-25 ENCOUNTER — Other Ambulatory Visit: Payer: Self-pay

## 2023-02-25 ENCOUNTER — Inpatient Hospital Stay (HOSPITAL_COMMUNITY): Payer: 59 | Admitting: Anesthesiology

## 2023-02-25 ENCOUNTER — Inpatient Hospital Stay (HOSPITAL_COMMUNITY)
Admission: AD | Admit: 2023-02-25 | Discharge: 2023-02-27 | DRG: 807 | Disposition: A | Discharge status: Home / Self Care | Payer: 59 | Attending: Obstetrics and Gynecology | Admitting: Obstetrics and Gynecology

## 2023-02-25 ENCOUNTER — Inpatient Hospital Stay (HOSPITAL_COMMUNITY): Payer: 59

## 2023-02-25 ENCOUNTER — Encounter (HOSPITAL_COMMUNITY): Payer: Self-pay | Admitting: Obstetrics and Gynecology

## 2023-02-25 DIAGNOSIS — F419 Anxiety disorder, unspecified: Secondary | ICD-10-CM | POA: Diagnosis present

## 2023-02-25 DIAGNOSIS — O9902 Anemia complicating childbirth: Secondary | ICD-10-CM | POA: Diagnosis present

## 2023-02-25 DIAGNOSIS — Z8249 Family history of ischemic heart disease and other diseases of the circulatory system: Secondary | ICD-10-CM

## 2023-02-25 DIAGNOSIS — Z833 Family history of diabetes mellitus: Secondary | ICD-10-CM | POA: Diagnosis not present

## 2023-02-25 DIAGNOSIS — O99344 Other mental disorders complicating childbirth: Secondary | ICD-10-CM | POA: Diagnosis present

## 2023-02-25 DIAGNOSIS — Z818 Family history of other mental and behavioral disorders: Secondary | ICD-10-CM

## 2023-02-25 DIAGNOSIS — O99824 Streptococcus B carrier state complicating childbirth: Secondary | ICD-10-CM | POA: Diagnosis present

## 2023-02-25 DIAGNOSIS — Z3A40 40 weeks gestation of pregnancy: Secondary | ICD-10-CM | POA: Diagnosis not present

## 2023-02-25 DIAGNOSIS — Z825 Family history of asthma and other chronic lower respiratory diseases: Secondary | ICD-10-CM

## 2023-02-25 DIAGNOSIS — Z23 Encounter for immunization: Secondary | ICD-10-CM | POA: Diagnosis not present

## 2023-02-25 DIAGNOSIS — Z3490 Encounter for supervision of normal pregnancy, unspecified, unspecified trimester: Secondary | ICD-10-CM | POA: Diagnosis present

## 2023-02-25 LAB — CBC
HCT: 42.4 % (ref 36.0–46.0)
Hemoglobin: 14.9 g/dL (ref 12.0–15.0)
MCH: 31 pg (ref 26.0–34.0)
MCHC: 35.1 g/dL (ref 30.0–36.0)
MCV: 88.1 fL (ref 80.0–100.0)
Platelets: 174 10*3/uL (ref 150–400)
RBC: 4.81 MIL/uL (ref 3.87–5.11)
RDW: 12.5 % (ref 11.5–15.5)
WBC: 8.3 10*3/uL (ref 4.0–10.5)
nRBC: 0 % (ref 0.0–0.2)

## 2023-02-25 LAB — TYPE AND SCREEN
ABO/RH(D): O NEG
Antibody Screen: POSITIVE

## 2023-02-25 LAB — RPR: RPR Ser Ql: NONREACTIVE

## 2023-02-25 MED ORDER — OXYTOCIN-SODIUM CHLORIDE 30-0.9 UT/500ML-% IV SOLN: 2.5000 [IU]/h | INTRAVENOUS | Status: DC

## 2023-02-25 MED ORDER — EPHEDRINE 5 MG/ML INJ: 10.0000 mg | INTRAVENOUS | Status: DC | PRN

## 2023-02-25 MED ORDER — FENTANYL-BUPIVACAINE-NACL 0.5-0.125-0.9 MG/250ML-% EP SOLN
12.0000 mL/h | EPIDURAL | Status: DC | PRN
  Administered 2023-02-25: 12 mL/h via EPIDURAL
  Filled 2023-02-25: qty 250

## 2023-02-25 MED ORDER — FENTANYL CITRATE (PF) 100 MCG/2ML IJ SOLN: 50.0000 ug | INTRAMUSCULAR | Status: DC | PRN

## 2023-02-25 MED ORDER — METHYLERGONOVINE MALEATE 0.2 MG PO TABS: 0.2000 mg | ORAL_TABLET | ORAL | Status: DC | PRN

## 2023-02-25 MED ORDER — SOD CITRATE-CITRIC ACID 500-334 MG/5ML PO SOLN: 30.0000 mL | ORAL | Status: DC | PRN

## 2023-02-25 MED ORDER — IBUPROFEN 600 MG PO TABS
600.0000 mg | ORAL_TABLET | Freq: Four times a day (QID) | ORAL | Status: DC
  Administered 2023-02-25 – 2023-02-27 (×8): 600 mg via ORAL
  Filled 2023-02-25 (×7): qty 1

## 2023-02-25 MED ORDER — WITCH HAZEL-GLYCERIN EX PADS: 1.0000 | MEDICATED_PAD | CUTANEOUS | Status: DC | PRN

## 2023-02-25 MED ORDER — LACTATED RINGERS IV SOLN
500.0000 mL | INTRAVENOUS | Status: DC | PRN
  Administered 2023-02-25 (×2): 500 mL via INTRAVENOUS

## 2023-02-25 MED ORDER — ZOLPIDEM TARTRATE 5 MG PO TABS: 5.0000 mg | ORAL_TABLET | Freq: Every evening | ORAL | Status: DC | PRN

## 2023-02-25 MED ORDER — PHENYLEPHRINE 80 MCG/ML (10ML) SYRINGE FOR IV PUSH (FOR BLOOD PRESSURE SUPPORT)
80.0000 ug | PREFILLED_SYRINGE | INTRAVENOUS | Status: DC | PRN
  Filled 2023-02-25: qty 10

## 2023-02-25 MED ORDER — COCONUT OIL OIL
1.0000 | TOPICAL_OIL | Status: DC | PRN
  Administered 2023-02-25: 1 via TOPICAL

## 2023-02-25 MED ORDER — LACTATED RINGERS IV SOLN
500.0000 mL | Freq: Once | INTRAVENOUS | Status: AC
  Administered 2023-02-25: 500 mL via INTRAVENOUS

## 2023-02-25 MED ORDER — PRENATAL MULTIVITAMIN CH
1.0000 | ORAL_TABLET | Freq: Every day | ORAL | Status: DC
  Administered 2023-02-26 – 2023-02-27 (×2): 1 via ORAL
  Filled 2023-02-25 (×2): qty 1

## 2023-02-25 MED ORDER — TERBUTALINE SULFATE 1 MG/ML IJ SOLN: 0.2500 mg | Freq: Once | INTRAMUSCULAR | Status: DC | PRN

## 2023-02-25 MED ORDER — SODIUM CHLORIDE 0.9 % IV SOLN
5.0000 10*6.[IU] | Freq: Once | INTRAVENOUS | Status: AC
  Administered 2023-02-25: 5 10*6.[IU] via INTRAVENOUS
  Filled 2023-02-25: qty 5

## 2023-02-25 MED ORDER — DIPHENHYDRAMINE HCL 50 MG/ML IJ SOLN: 12.5000 mg | INTRAMUSCULAR | Status: DC | PRN

## 2023-02-25 MED ORDER — PENICILLIN G POT IN DEXTROSE 60000 UNIT/ML IV SOLN
3.0000 10*6.[IU] | INTRAVENOUS | Status: DC
  Administered 2023-02-25 (×3): 3 10*6.[IU] via INTRAVENOUS
  Filled 2023-02-25 (×4): qty 50

## 2023-02-25 MED ORDER — ONDANSETRON HCL 4 MG/2ML IJ SOLN: 4.0000 mg | INTRAMUSCULAR | Status: DC | PRN

## 2023-02-25 MED ORDER — RHO D IMMUNE GLOBULIN 1500 UNIT/2ML IJ SOSY
300.0000 ug | PREFILLED_SYRINGE | Freq: Once | INTRAMUSCULAR | Status: AC
  Administered 2023-02-26: 300 ug via INTRAVENOUS
  Filled 2023-02-25: qty 2

## 2023-02-25 MED ORDER — OXYTOCIN BOLUS FROM INFUSION
333.0000 mL | Freq: Once | INTRAVENOUS | Status: AC
  Administered 2023-02-25: 333 mL via INTRAVENOUS

## 2023-02-25 MED ORDER — ONDANSETRON HCL 4 MG PO TABS: 4.0000 mg | ORAL_TABLET | ORAL | Status: DC | PRN

## 2023-02-25 MED ORDER — OXYTOCIN-SODIUM CHLORIDE 30-0.9 UT/500ML-% IV SOLN
1.0000 m[IU]/min | INTRAVENOUS | Status: DC
  Administered 2023-02-25: 4 m[IU]/min via INTRAVENOUS
  Administered 2023-02-25: 2 m[IU]/min via INTRAVENOUS
  Filled 2023-02-25: qty 500

## 2023-02-25 MED ORDER — SIMETHICONE 80 MG PO CHEW: 80.0000 mg | CHEWABLE_TABLET | ORAL | Status: DC | PRN

## 2023-02-25 MED ORDER — DIBUCAINE (PERIANAL) 1 % EX OINT: 1.0000 | TOPICAL_OINTMENT | CUTANEOUS | Status: DC | PRN

## 2023-02-25 MED ORDER — BENZOCAINE-MENTHOL 20-0.5 % EX AERO
1.0000 | INHALATION_SPRAY | CUTANEOUS | Status: DC | PRN
  Administered 2023-02-25: 1 via TOPICAL
  Filled 2023-02-25 (×2): qty 56

## 2023-02-25 MED ORDER — LIDOCAINE HCL (PF) 1 % IJ SOLN
INTRAMUSCULAR | Status: DC | PRN
  Administered 2023-02-25 (×2): 4 mL via EPIDURAL

## 2023-02-25 MED ORDER — ACETAMINOPHEN 325 MG PO TABS: 650.0000 mg | ORAL_TABLET | ORAL | Status: DC | PRN

## 2023-02-25 MED ORDER — ONDANSETRON HCL 4 MG/2ML IJ SOLN
4.0000 mg | Freq: Four times a day (QID) | INTRAMUSCULAR | Status: DC | PRN
  Administered 2023-02-25: 4 mg via INTRAVENOUS
  Filled 2023-02-25: qty 2

## 2023-02-25 MED ORDER — PHENYLEPHRINE 80 MCG/ML (10ML) SYRINGE FOR IV PUSH (FOR BLOOD PRESSURE SUPPORT)
80.0000 ug | PREFILLED_SYRINGE | INTRAVENOUS | Status: DC | PRN
  Administered 2023-02-25 (×2): 80 ug via INTRAVENOUS

## 2023-02-25 MED ORDER — TETANUS-DIPHTH-ACELL PERTUSSIS 5-2.5-18.5 LF-MCG/0.5 IM SUSY: 0.5000 mL | PREFILLED_SYRINGE | Freq: Once | INTRAMUSCULAR | Status: DC

## 2023-02-25 MED ORDER — LIDOCAINE HCL (PF) 1 % IJ SOLN: 30.0000 mL | INTRAMUSCULAR | Status: DC | PRN

## 2023-02-25 MED ORDER — METHYLERGONOVINE MALEATE 0.2 MG/ML IJ SOLN: 0.2000 mg | INTRAMUSCULAR | Status: DC | PRN

## 2023-02-25 MED ORDER — LACTATED RINGERS IV SOLN: INTRAVENOUS | Status: DC

## 2023-02-25 MED ORDER — SENNOSIDES-DOCUSATE SODIUM 8.6-50 MG PO TABS
2.0000 | ORAL_TABLET | Freq: Every day | ORAL | Status: DC
  Administered 2023-02-26 – 2023-02-27 (×2): 2 via ORAL
  Filled 2023-02-25 (×2): qty 2

## 2023-02-25 MED ORDER — DIPHENHYDRAMINE HCL 25 MG PO CAPS: 25.0000 mg | ORAL_CAPSULE | Freq: Four times a day (QID) | ORAL | Status: DC | PRN

## 2023-02-25 MED ORDER — ACETAMINOPHEN 325 MG PO TABS
650.0000 mg | ORAL_TABLET | ORAL | Status: DC | PRN
  Administered 2023-02-25 – 2023-02-26 (×3): 650 mg via ORAL
  Filled 2023-02-25 (×3): qty 2

## 2023-02-25 NOTE — Progress Notes (Signed)
Comfortable after epidural BP (!) 86/54   Pulse 76   Temp 97.6 F (36.4 C)   Resp 16   Ht 5\' 8"  (1.727 m)   Wt 83.6 kg   SpO2 100%   BMI 28.04 kg/m   FHR reviewed , Category 1 Restart Pitocin augmentation.

## 2023-02-25 NOTE — Plan of Care (Signed)

## 2023-02-25 NOTE — Anesthesia Procedure Notes (Signed)
Epidural Patient location during procedure: OB Start time: 02/25/2023 10:49 AM End time: 02/25/2023 10:54 AM  Staffing Anesthesiologist: Linton Rump, MD Performed: anesthesiologist   Preanesthetic Checklist Completed: patient identified, IV checked, site marked, risks and benefits discussed, surgical consent, monitors and equipment checked, pre-op evaluation and timeout performed  Epidural Patient position: sitting Prep: DuraPrep and site prepped and draped Patient monitoring: continuous pulse ox and blood pressure Approach: midline Location: L3-L4 Injection technique: LOR saline  Needle:  Needle type: Tuohy  Needle gauge: 17 G Needle length: 9 cm and 9 Needle insertion depth: 4.5 cm Catheter type: closed end flexible Catheter size: 19 Gauge Catheter at skin depth: 9 cm Test dose: negative  Assessment Events: blood not aspirated, no cerebrospinal fluid, injection not painful, no injection resistance, no paresthesia and negative IV test  Additional Notes The patient has requested an epidural for labor pain management. Risks and benefits including, but not limited to, infection, bleeding, local anesthetic toxicity, headache, hypotension, back pain, block failure, etc. were discussed with the patient. The patient expressed understanding and consented to the procedure. I confirmed that the patient has no bleeding disorders and is not taking blood thinners. I confirmed the patient's last platelet count with the nurse. A time-out was performed immediately prior to the procedure. Please see nursing documentation for vital signs. Sterile technique was used throughout the whole procedure. Once LOR achieved, the epidural catheter threaded easily without resistance. Aspiration of the catheter was negative for blood and CSF. The epidural was dosed slowly and an infusion was started.  1 attempt(s)Reason for block:procedure for pain

## 2023-02-25 NOTE — Lactation Note (Signed)
This note was copied from a baby's chart. Lactation Consultation Note  Patient Name: April Gibbs ZOXWR'U Date: 02/25/2023 Age:29 hours  Attempted to see mom but she was sleeping.   Maternal Data    Feeding    LATCH Score                    Lactation Tools Discussed/Used    Interventions    Discharge    Consult Status      Charyl Dancer 02/25/2023, 9:36 PM

## 2023-02-25 NOTE — Anesthesia Preprocedure Evaluation (Signed)
Anesthesia Evaluation  Patient identified by MRN, date of birth, ID band Patient awake    Reviewed: Allergy & Precautions, NPO status , Patient's Chart, lab work & pertinent test results  History of Anesthesia Complications Negative for: history of anesthetic complications  Airway Mallampati: III  TM Distance: >3 FB Neck ROM: Full    Dental   Pulmonary neg pulmonary ROS   Pulmonary exam normal breath sounds clear to auscultation       Cardiovascular negative cardio ROS  Rhythm:Regular Rate:Normal     Neuro/Psych  Headaches PSYCHIATRIC DISORDERS Anxiety        GI/Hepatic negative GI ROS, Neg liver ROS,,,  Endo/Other  negative endocrine ROS    Renal/GU negative Renal ROS     Musculoskeletal   Abdominal   Peds  Hematology  (+) Blood dyscrasia, anemia Lab Results      Component                Value               Date                      WBC                      8.3                 02/25/2023                HGB                      14.9                02/25/2023                HCT                      42.4                02/25/2023                MCV                      88.1                02/25/2023                PLT                      174                 02/25/2023              Anesthesia Other Findings   Reproductive/Obstetrics (+) Pregnancy                             Anesthesia Physical Anesthesia Plan  ASA: 2  Anesthesia Plan: Epidural   Post-op Pain Management:    Induction:   PONV Risk Score and Plan:   Airway Management Planned:   Additional Equipment:   Intra-op Plan:   Post-operative Plan:   Informed Consent: I have reviewed the patients History and Physical, chart, labs and discussed the procedure including the risks, benefits and alternatives for the proposed anesthesia with the patient or authorized representative who has indicated his/her understanding and  acceptance.  Plan Discussed with: Anesthesiologist  Anesthesia Plan Comments: (I have discussed risks of neuraxial anesthesia including but not limited to infection, bleeding, nerve injury, back pain, headache, seizures, and failure of block. Patient denies bleeding disorders and is not currently anticoagulated. Labs have been reviewed. Risks and benefits discussed. All patient's questions answered.  )       Anesthesia Quick Evaluation

## 2023-02-26 LAB — CBC
HCT: 36.5 % (ref 36.0–46.0)
Hemoglobin: 12.3 g/dL (ref 12.0–15.0)
MCH: 29.9 pg (ref 26.0–34.0)
MCHC: 33.7 g/dL (ref 30.0–36.0)
MCV: 88.8 fL (ref 80.0–100.0)
Platelets: 156 10*3/uL (ref 150–400)
RBC: 4.11 MIL/uL (ref 3.87–5.11)
RDW: 12.7 % (ref 11.5–15.5)
WBC: 10.8 10*3/uL — ABNORMAL HIGH (ref 4.0–10.5)
nRBC: 0 % (ref 0.0–0.2)

## 2023-02-26 NOTE — Social Work (Signed)
MOB was referred for history of depression/anxiety.  * Referral screened out by Clinical Social Worker because none of the following criteria appear to apply:  ~ History of anxiety/depression during this pregnancy, or of post-partum depression following prior delivery.  ~ Diagnosis of anxiety and/or depression within last 3 years OR * MOB's symptoms currently being treated with medication and/or therapy.  Per chart review MOB experienced situational anxiety "normal emotions" due to vanishing twin pregnancy in 2022, No concerns noted during this pregnancy.   Please contact the Clinical Social Worker if needs arise, or by MOB request.   Wende Neighbors, LCSWA Clinical Social Worker [phone]

## 2023-02-26 NOTE — Lactation Note (Signed)
This note was copied from a baby's chart. Lactation Consultation Note  Patient Name: April Gibbs ZOXWR'U Date: 02/26/2023 Age:29 hours Reason for consult: Initial assessment;Term Experienced BF mom stated she had a lot of challenges w/her first child now 3 w/BF having nipple trauma.mom would like to prevent that this time. Mom's nipples are different d/t BF for 13 months. Nipples are compressible and colostrum easily expressed. Baby sleepy. LC gave 1 ml colostrum in spoon. Baby took well and was ready to feed afterwards. Placed in football hold to get deep latch. Top lip flanged well. Chin tug done and mom denies painful latch. Some swallows noted and significant milk transfer from breast noted. Mom excited. Praised mom. Newborn feeding habits, STS, I&O, support, positioning, props, reviewed. Mom encouraged to feed baby 8-12 times/24 hours and with feeding cues.  Gave mom hand pump for pre-pumping if needed. Mom stated she used a #24 flange. Looks big. LC also gave #21 which looks better. Told mom to try both see which one is more comfortable and more effective. Shells given as well to wear in bra in am. Answered questions parents had. Encouraged to call for assistance as needed.  Maternal Data Has patient been taught Hand Expression?: Yes Does the patient have breastfeeding experience prior to this delivery?: Yes How long did the patient breastfeed?: 13 months  Feeding    LATCH Score Latch: Grasps breast easily, tongue down, lips flanged, rhythmical sucking.  Audible Swallowing: Spontaneous and intermittent  Type of Nipple: Everted at rest and after stimulation (very short shaft)  Comfort (Breast/Nipple): Soft / non-tender  Hold (Positioning): Assistance needed to correctly position infant at breast and maintain latch.  LATCH Score: 9   Lactation Tools Discussed/Used Tools: Shells;Pump;Flanges Flange Size: 21 Breast pump type: Manual Pump Education: Setup, frequency,  and cleaning;Milk Storage Reason for Pumping: pre-pumping Pumping frequency: pre-pump if needed  Interventions Interventions: Breast feeding basics reviewed;Assisted with latch;Skin to skin;Breast massage;Hand express;Pre-pump if needed;Breast compression;Adjust position;Support pillows;Position options;Expressed milk;Shells;Hand pump;Education;LC Services brochure  Discharge    Consult Status Consult Status: Follow-up Date: 02/26/23 Follow-up type: In-patient    Charyl Dancer 02/26/2023, 12:31 AM

## 2023-02-26 NOTE — Anesthesia Postprocedure Evaluation (Signed)
Anesthesia Post Note  Patient: April Gibbs  Procedure(s) Performed: AN AD HOC LABOR EPIDURAL     Patient location during evaluation: Mother Baby Anesthesia Type: Epidural Level of consciousness: awake and alert Pain management: pain level controlled Vital Signs Assessment: post-procedure vital signs reviewed and stable Respiratory status: spontaneous breathing, nonlabored ventilation and respiratory function stable Cardiovascular status: stable Postop Assessment: no headache, no backache and epidural receding Anesthetic complications: no   No notable events documented.  Last Vitals:  Vitals:   02/26/23 0245 02/26/23 0558  BP: 98/63 109/70  Pulse: (!) 55 62  Resp:    Temp: 36.6 C 36.7 C  SpO2: 98% 100%    Last Pain:  Vitals:   02/26/23 0603  TempSrc:   PainSc: 4    Pain Goal:                   [name]

## 2023-02-26 NOTE — Progress Notes (Signed)
Pediatrician is keeping infant to work on feedings. Patient would like to stay a patient with her baby. Called Dr. Juliene Pina to cancel discharge today. Earl Gala, Linda Hedges Ider

## 2023-02-26 NOTE — Lactation Note (Signed)
This note was copied from a baby's chart. Lactation Consultation Note  Patient Name: April Gibbs [id number] Date: 02/26/2023 Age:29 hours Reason for consult: Follow-up assessment  Observed latch with swallows.  Feed on demand with cues.  Goal 8-12+ times per day after first 24 hrs.  Place baby STS if not cueing.  Mom made aware of O/P services, breastfeeding support groups, community resources, and our phone # for post-discharge questions.    Maternal Data Has patient been taught Hand Expression?: Yes  Feeding Mother's Current Feeding Choice: Breast Milk  LATCH Score Latch: Grasps breast easily, tongue down, lips flanged, rhythmical sucking.  Audible Swallowing: Spontaneous and intermittent  Type of Nipple: Everted at rest and after stimulation  Comfort (Breast/Nipple): Soft / non-tender  Hold (Positioning): Assistance needed to correctly position infant at breast and maintain latch.  LATCH Score: 9   Interventions Interventions: Breast feeding basics reviewed;Assisted with latch;Education Consult Status Consult Status: Follow-up Date: 02/27/23 Follow-up type: In-patient    Dahlia Byes [facility] 02/26/2023, 3:15 PM

## 2023-02-26 NOTE — Discharge Summary (Signed)
Postpartum Discharge Summary  Date of Service updated 02/27/23     Patient Name: April Gibbs DOB: 1995/07/04 MRN: [id number]  Date of admission: 02/25/2023 Delivery date:02/25/2023 Delivering provider: Olivia Mackie Date of discharge: 02/27/2023  Admitting diagnosis: Encounter for induction of labor [Z34.90] Intrauterine pregnancy: [redacted]w[redacted]d     Secondary diagnosis:  Principal Problem:   Postpartum care following vaginal delivery 10/3 Active Problems:   Encounter for induction of labor   SVD (spontaneous vaginal delivery)   Anxiety   Second degree perineal laceration  Additional problems: n/a    Discharge diagnosis: Term Pregnancy Delivered                                              Post partum procedures: n/a Augmentation: AROM and Pitocin Complications: None  Hospital course: Induction of Labor With Vaginal Delivery   27 y.o. yo G2P2002 at [redacted]w[redacted]d was admitted to the hospital 02/25/2023 for induction of labor.  Indication for induction: Elective.  Patient had an labor course complicated by none Membrane Rupture Time/Date: 8:25 AM,02/25/2023  Delivery Method:Vaginal, Spontaneous Operative Delivery:N/A Episiotomy: None Lacerations:  2nd degree Details of delivery can be found in separate delivery note.  Patient had a postpartum course complicated by none. Patient is discharged home 02/26/23.  Newborn Data: Birth date:02/25/2023 Birth time:4:19 PM Gender:Female Living status:Living Apgars:8 ,9  Weight:3515 g  Magnesium Sulfate received: No BMZ received: No Rhophylac:Yes MMR:N/A T-DaP:Given prenatally Flu: Yes RSV Vaccine received: No Transfusion:No Immunizations administered: Immunization History  Administered Date(s) Administered   Influenza,inj,Quad PF,6+ Mos 03/03/2019   Influenza-Unspecified 02/26/2017, 02/22/2018   Tdap 05/25/2016    Physical exam  Vitals:   02/25/23 2345 02/26/23 0245 02/26/23 0558 02/26/23 1000  BP: 98/60 98/63 109/70 107/67   Pulse: 85 (!) 55 62 81  Resp: 18   20  Temp: 97.7 F (36.5 C) 97.8 F (36.6 C) 98.1 F (36.7 C) 97.9 F (36.6 C)  TempSrc: Oral Oral Oral Oral  SpO2: 97% 98% 100% 97%  Weight:      Height:       General: alert and no distress Lochia: appropriate Uterine Fundus: firm Incision: N/A DVT Evaluation: No evidence of DVT seen on physical exam. Labs: Lab Results  Component Value Date   WBC 10.8 (H) 02/26/2023   HGB 12.3 02/26/2023   HCT 36.5 02/26/2023   MCV 88.8 02/26/2023   PLT 156 02/26/2023      Latest Ref Rng & Units 01/08/2020   11:18 AM  CMP  Glucose 65 - 99 mg/dL 82   BUN 7 - 25 mg/dL 9   Creatinine 7.82 - 9.56 mg/dL 2.13   Sodium 086 - 578 mmol/L 137   Potassium 3.5 - 5.3 mmol/L 4.4   Chloride 98 - 110 mmol/L 102   CO2 20 - 32 mmol/L 26   Calcium 8.6 - 10.2 mg/dL 9.4   Total Protein 6.1 - 8.1 g/dL 6.9   Total Bilirubin 0.2 - 1.2 mg/dL 0.7   AST 10 - 30 U/L 18   ALT 6 - 29 U/L 12    Edinburgh Score:    02/25/2023    6:41 PM  Edinburgh Postnatal Depression Scale Screening Tool  I have been able to laugh and see the funny side of things. 0  I have looked forward with enjoyment to things. 0  I have blamed  myself unnecessarily when things went wrong. 0  I have been anxious or worried for no good reason. 0  I have felt scared or panicky for no good reason. 0  Things have been getting on top of me. 0  I have been so unhappy that I have had difficulty sleeping. 0  I have felt sad or miserable. 0  I have been so unhappy that I have been crying. 0  The thought of harming myself has occurred to me. 0  Edinburgh Postnatal Depression Scale Total 0      After visit meds:  Allergies as of 02/26/2023   No Known Allergies      Medication List     STOP taking these medications    benzocaine-Menthol 20-0.5 % Aero Commonly known as: DERMOPLAST   oxyCODONE-acetaminophen 5-325 MG tablet Commonly known as: PERCOCET/ROXICET       TAKE these medications     acetaminophen 325 MG tablet Commonly known as: Tylenol Take 2 tablets (650 mg total) by mouth every 4 (four) hours as needed (for pain scale < 4).   ibuprofen 600 MG tablet Commonly known as: ADVIL Take 1 tablet (600 mg total) by mouth every 6 (six) hours.   prenatal multivitamin Tabs tablet Take 1 tablet by mouth in the morning and at bedtime. Mini softgel; pt takes 1 time daily.               Discharge Care Instructions  (From admission, onward)           Start     Ordered   02/26/23 0000  Discharge wound care:       Comments: Sitz baths 2 times /day with warm water x 1 week. May add herbals: 1 ounce dried comfrey leaf* 1 ounce calendula flowers 1 ounce lavender flowers  Supplies can be found online at [employer] sources at [employer], [employer]  1/2 ounce dried uva ursi leaves 1/2 ounce witch hazel blossoms (if you can find them) 1/2 ounce dried sage leaf 1/2 cup sea salt Directions: Bring 2 quarts of water to a boil. Turn off heat, and place 1 ounce (approximately 1 large handful) of the above mixed herbs (not the salt) into the pot. Steep, covered, for 30 minutes.  Strain the liquid well with a fine mesh strainer, and discard the herb material. Add 2 quarts of liquid to the tub, along with the 1/2 cup of salt. This medicinal liquid can also be made into compresses and peri-rinses.   02/26/23 1132             Discharge home in stable condition Infant Feeding: Breast Infant Disposition:home with mother Discharge instruction: per After Visit Summary and Postpartum booklet. Activity: Advance as tolerated. Pelvic rest for 6 weeks.  Diet: routine diet Anticipated Birth Control: Unsure Postpartum Appointment:6 weeks Additional Postpartum F/U:  n/a Future Appointments:No future appointments. Follow up Visit:  Patient to follow up with Dr. Billy Coast at [facility] in 6 weeks for postpartum visit or sooner as needed.     02/26/2023 Cassandra A Law, DO

## 2023-02-27 LAB — RH IG WORKUP (INCLUDES ABO/RH)
Fetal Screen: NEGATIVE
Gestational Age(Wks): 40
Unit division: 0

## 2023-02-27 NOTE — Plan of Care (Signed)
Discharge teaching complete. Adequate for discharge.

## 2023-02-27 NOTE — Lactation Note (Signed)
This note was copied from a baby's chart. Lactation Consultation Note  Patient Name: April Gibbs [id number] Date: 02/27/2023 Age:29 hours, P2  Reason for consult: Follow-up assessment;Nipple pain/trauma;Term;Infant weight loss;Breastfeeding assistance (6% weight loss,) LC reviewed and updated the doc flow sheets with parents, WNL for D/C.  Per mom having sore nipples ,LC offered to assess and mom receptive.  LC noted both nipples to be pinky red, small intact abrasion on the right nipple, the left was ok. Areola edema on both breast , more on the left.  MOm desires to have LC see a latch,  LC had mom do the steps for latching and prior to latching the areola on the right was elongated. LC assisted and showed dad how he could help to latch until the soreness improves. After LC eased down the chin, per mom felt comfortable.  Baby fed 25 mins , :Latch score 8, milk is coming in. After mom released the baby from the breast the nipple was well rounded.  LC reviewed BF D/C teaching and the [facility] resources,  Dad took notes for the steps for latching.  Per mom felt more at ease after [facility] assessment.  Mom already has shells.   Maternal Data Has patient been taught Hand Expression?: Yes (milk coming in , breast warm and easy to hand express) Does the patient have breastfeeding experience prior to this delivery?: Yes  Feeding Mother's Current Feeding Choice: Breast Milk  LATCH Score Latch: Grasps breast easily, tongue down, lips flanged, rhythmical sucking.  Audible Swallowing: Spontaneous and intermittent  Type of Nipple: Everted at rest and after stimulation  Comfort (Breast/Nipple): Filling, red/small blisters or bruises, mild/mod discomfort  Hold (Positioning): Assistance needed to correctly position infant at breast and maintain latch.  LATCH Score: 8   Lactation Tools Discussed/Used Tools: Shells;Pump;Flanges Flange Size: 21;24 Breast pump type: Manual Pump Education: Setup,  frequency, and cleaning;Milk Storage Reason for Pumping: LC recommended steps for latching includng reverse pressure  Interventions Interventions: Breast feeding basics reviewed;Assisted with latch;Skin to skin;Breast massage;Hand express;Pre-pump if needed;Reverse pressure;Breast compression;Adjust position;Support pillows;Position options;Shells;Coconut oil;Hand pump;Education;LC Services brochure  Discharge Discharge Education: Engorgement and breast care;Warning signs for feeding baby;Outpatient recommendation (if sore nipples don"t clear up by 4 days) Pump: Personal;DEBP;Manual WIC Program: No  Consult Status Consult Status: Complete Date: 02/27/23    Kathrin Greathouse 02/27/2023, 10:41 AM

## 2023-03-23 ENCOUNTER — Telehealth (HOSPITAL_COMMUNITY): Payer: Self-pay | Admitting: *Deleted

## 2023-03-23 NOTE — Telephone Encounter (Signed)
03/23/2023  Name: April Gibbs MRN: [id number] DOB: August 23, 1995  Reason for Call:  Transition of Care Hospital Discharge Call  Contact Status: Patient Contact Status: Complete  Language assistant needed: Interpreter Mode: Interpreter Not Needed        Follow-Up Questions: Do You Have Any Concerns About Your Health As You Heal From Delivery?: No Do You Have Any Concerns About Your Infants Health?: No  Edinburgh Postnatal Depression Scale:  In the Past 7 Days: I have been able to laugh and see the funny side of things.: As much as I always could I have looked forward with enjoyment to things.: As much as I ever did I have blamed myself unnecessarily when things went wrong.: No, never I have been anxious or worried for no good reason.: No, not at all I have felt scared or panicky for no good reason.: No, not at all Things have been getting on top of me.: No, I have been coping as well as ever I have been so unhappy that I have had difficulty sleeping.: Not at all I have felt sad or miserable.: No, not at all I have been so unhappy that I have been crying.: No, never The thought of harming myself has occurred to me.: Never Inocente Salles Postnatal Depression Scale Total: 0  PHQ2-9 Depression Scale:     Discharge Follow-up: Edinburgh score requires follow up?: No Patient was advised of the following resources:: Support Group, Breastfeeding Support Group (declines postpartum group information via email)  Post-discharge interventions: Reviewed Newborn Safe Sleep Practices  Salena Saner, RN 03/23/2023 10:59
